# Patient Record
Sex: Female | Born: 1965 | Race: Black or African American | Hispanic: No | Smoking: Never smoker
Health system: Southern US, Community
[De-identification: ages and names within clinical notes are randomized; demographics above are authoritative.]

## PROBLEM LIST (undated history)

## (undated) DIAGNOSIS — B9689 Other specified bacterial agents as the cause of diseases classified elsewhere: Secondary | ICD-10-CM

## (undated) DIAGNOSIS — I10 Essential (primary) hypertension: Secondary | ICD-10-CM

## (undated) DIAGNOSIS — N760 Acute vaginitis: Secondary | ICD-10-CM

## (undated) DIAGNOSIS — F32A Depression, unspecified: Secondary | ICD-10-CM

## (undated) DIAGNOSIS — F329 Major depressive disorder, single episode, unspecified: Secondary | ICD-10-CM

## (undated) DIAGNOSIS — F419 Anxiety disorder, unspecified: Secondary | ICD-10-CM

## (undated) HISTORY — PX: CHOLECYSTECTOMY: SHX55

## (undated) HISTORY — PX: DILATION AND CURETTAGE OF UTERUS: SHX78

---

## 2004-01-28 ENCOUNTER — Emergency Department (HOSPITAL_COMMUNITY): Admission: EM | Admit: 2004-01-28 | Discharge: 2004-01-28 | Payer: Self-pay | Admitting: Family Medicine

## 2004-06-12 ENCOUNTER — Emergency Department (HOSPITAL_COMMUNITY): Admission: EM | Admit: 2004-06-12 | Discharge: 2004-06-12 | Payer: Self-pay | Admitting: Emergency Medicine

## 2004-11-08 ENCOUNTER — Ambulatory Visit: Payer: Self-pay | Admitting: Nurse Practitioner

## 2004-12-27 ENCOUNTER — Ambulatory Visit (HOSPITAL_COMMUNITY): Admission: RE | Admit: 2004-12-27 | Discharge: 2004-12-27 | Payer: Self-pay | Admitting: Internal Medicine

## 2005-03-22 ENCOUNTER — Ambulatory Visit: Payer: Self-pay | Admitting: Nurse Practitioner

## 2005-03-31 ENCOUNTER — Ambulatory Visit: Payer: Self-pay | Admitting: Obstetrics and Gynecology

## 2005-11-08 ENCOUNTER — Emergency Department (HOSPITAL_COMMUNITY): Admission: EM | Admit: 2005-11-08 | Discharge: 2005-11-08 | Payer: Self-pay | Admitting: Family Medicine

## 2006-02-27 ENCOUNTER — Emergency Department (HOSPITAL_COMMUNITY): Admission: EM | Admit: 2006-02-27 | Discharge: 2006-02-27 | Payer: Self-pay | Admitting: Emergency Medicine

## 2006-09-22 ENCOUNTER — Emergency Department (HOSPITAL_COMMUNITY): Admission: EM | Admit: 2006-09-22 | Discharge: 2006-09-22 | Payer: Self-pay | Admitting: Emergency Medicine

## 2007-04-01 ENCOUNTER — Emergency Department (HOSPITAL_COMMUNITY): Admission: EM | Admit: 2007-04-01 | Discharge: 2007-04-01 | Payer: Self-pay | Admitting: Emergency Medicine

## 2007-05-26 ENCOUNTER — Emergency Department (HOSPITAL_COMMUNITY): Admission: EM | Admit: 2007-05-26 | Discharge: 2007-05-26 | Payer: Self-pay | Admitting: Emergency Medicine

## 2008-07-04 ENCOUNTER — Emergency Department (HOSPITAL_COMMUNITY): Admission: EM | Admit: 2008-07-04 | Discharge: 2008-07-04 | Payer: Self-pay | Admitting: Family Medicine

## 2008-10-09 ENCOUNTER — Ambulatory Visit (HOSPITAL_COMMUNITY): Admission: RE | Admit: 2008-10-09 | Discharge: 2008-10-09 | Payer: Self-pay | Admitting: Obstetrics & Gynecology

## 2009-03-18 ENCOUNTER — Emergency Department (HOSPITAL_COMMUNITY): Admission: EM | Admit: 2009-03-18 | Discharge: 2009-03-18 | Payer: Self-pay | Admitting: Family Medicine

## 2009-05-01 ENCOUNTER — Emergency Department (HOSPITAL_COMMUNITY): Admission: EM | Admit: 2009-05-01 | Discharge: 2009-05-01 | Payer: Self-pay | Admitting: Emergency Medicine

## 2009-07-01 ENCOUNTER — Ambulatory Visit: Payer: Self-pay | Admitting: Obstetrics and Gynecology

## 2009-07-02 ENCOUNTER — Encounter: Payer: Self-pay | Admitting: Obstetrics and Gynecology

## 2009-07-02 LAB — CONVERTED CEMR LAB: Trich, Wet Prep: NONE SEEN

## 2009-12-26 ENCOUNTER — Emergency Department (HOSPITAL_COMMUNITY): Admission: EM | Admit: 2009-12-26 | Discharge: 2009-12-26 | Payer: Self-pay | Admitting: Emergency Medicine

## 2010-05-09 ENCOUNTER — Encounter: Payer: Self-pay | Admitting: *Deleted

## 2010-05-10 ENCOUNTER — Encounter: Payer: Self-pay | Admitting: Family Medicine

## 2010-05-17 ENCOUNTER — Ambulatory Visit: Admit: 2010-05-17 | Payer: Self-pay | Admitting: Obstetrics & Gynecology

## 2010-06-23 ENCOUNTER — Other Ambulatory Visit: Payer: Self-pay | Admitting: Internal Medicine

## 2010-06-23 DIAGNOSIS — Z1231 Encounter for screening mammogram for malignant neoplasm of breast: Secondary | ICD-10-CM

## 2010-06-25 ENCOUNTER — Ambulatory Visit: Payer: Self-pay

## 2010-07-01 LAB — POCT URINALYSIS DIPSTICK
Bilirubin Urine: NEGATIVE
Hgb urine dipstick: NEGATIVE
Ketones, ur: NEGATIVE mg/dL
Nitrite: NEGATIVE
Protein, ur: NEGATIVE mg/dL
pH: 7 (ref 5.0–8.0)

## 2010-07-01 LAB — WET PREP, GENITAL: Yeast Wet Prep HPF POC: NONE SEEN

## 2010-07-01 LAB — GC/CHLAMYDIA PROBE AMP, GENITAL: Chlamydia, DNA Probe: NEGATIVE

## 2010-07-04 LAB — POCT URINALYSIS DIP (DEVICE)
Glucose, UA: NEGATIVE mg/dL
Nitrite: NEGATIVE
Protein, ur: NEGATIVE mg/dL
Urobilinogen, UA: 0.2 mg/dL (ref 0.0–1.0)

## 2010-07-04 LAB — GC/CHLAMYDIA PROBE AMP, GENITAL: GC Probe Amp, Genital: NEGATIVE

## 2010-07-04 LAB — POCT PREGNANCY, URINE: Preg Test, Ur: NEGATIVE

## 2010-07-12 LAB — POCT PREGNANCY, URINE: Preg Test, Ur: NEGATIVE

## 2010-07-20 LAB — POCT URINALYSIS DIP (DEVICE)
Glucose, UA: NEGATIVE mg/dL
Ketones, ur: 40 mg/dL — AB
Specific Gravity, Urine: 1.025 (ref 1.005–1.030)
Urobilinogen, UA: 2 mg/dL — ABNORMAL HIGH (ref 0.0–1.0)

## 2010-07-20 LAB — WET PREP, GENITAL
Clue Cells Wet Prep HPF POC: NONE SEEN
Trich, Wet Prep: NONE SEEN
Yeast Wet Prep HPF POC: NONE SEEN

## 2010-07-20 LAB — GC/CHLAMYDIA PROBE AMP, GENITAL
Chlamydia, DNA Probe: NEGATIVE
GC Probe Amp, Genital: NEGATIVE

## 2010-07-29 LAB — WET PREP, GENITAL: Trich, Wet Prep: NONE SEEN

## 2010-07-29 LAB — POCT URINALYSIS DIP (DEVICE)
Glucose, UA: NEGATIVE mg/dL
Specific Gravity, Urine: 1.025 (ref 1.005–1.030)
Urobilinogen, UA: 0.2 mg/dL (ref 0.0–1.0)
pH: 5.5 (ref 5.0–8.0)

## 2010-07-29 LAB — URINE CULTURE

## 2010-07-29 LAB — GC/CHLAMYDIA PROBE AMP, GENITAL: GC Probe Amp, Genital: NEGATIVE

## 2011-01-24 LAB — URINALYSIS, ROUTINE W REFLEX MICROSCOPIC
Glucose, UA: NEGATIVE
Ketones, ur: NEGATIVE
pH: 7

## 2011-01-24 LAB — URINE MICROSCOPIC-ADD ON

## 2011-03-05 ENCOUNTER — Encounter: Payer: Self-pay | Admitting: *Deleted

## 2011-03-05 ENCOUNTER — Emergency Department (INDEPENDENT_AMBULATORY_CARE_PROVIDER_SITE_OTHER)
Admission: EM | Admit: 2011-03-05 | Discharge: 2011-03-05 | Disposition: A | Discharge status: Home / Self Care | Payer: Medicaid Other | Source: Home / Self Care | Attending: Family Medicine | Admitting: Family Medicine

## 2011-03-05 DIAGNOSIS — N760 Acute vaginitis: Secondary | ICD-10-CM

## 2011-03-05 HISTORY — DX: Other specified bacterial agents as the cause of diseases classified elsewhere: B96.89

## 2011-03-05 HISTORY — DX: Essential (primary) hypertension: I10

## 2011-03-05 HISTORY — DX: Acute vaginitis: N76.0

## 2011-03-05 LAB — POCT URINALYSIS DIP (DEVICE)
Bilirubin Urine: NEGATIVE
Glucose, UA: NEGATIVE mg/dL
Leukocytes, UA: NEGATIVE
Nitrite: NEGATIVE

## 2011-03-05 LAB — WET PREP, GENITAL: Trich, Wet Prep: NONE SEEN

## 2011-03-05 LAB — POCT PREGNANCY, URINE: Preg Test, Ur: NEGATIVE

## 2011-03-05 MED ORDER — METRONIDAZOLE 0.75 % VA GEL: VAGINAL | Status: DC

## 2011-03-05 NOTE — ED Provider Notes (Signed)
History     CSN: [id number] Arrival date & time: 03/05/2011 10:25 AM   First MD Initiated Contact with Patient 03/05/11 1000      No chief complaint on file.   (Consider location/radiation/quality/duration/timing/severity/associated sxs/prior treatment) Patient is a 45 y.o. female presenting with vaginal discharge.  Vaginal Discharge This is a recurrent problem. The current episode started more than 1 week ago (h/o bv 2 mos ago. now also with uti sx.). The problem occurs constantly. The problem has not changed since onset.The symptoms are aggravated by nothing. The symptoms are relieved by nothing.    Past Medical History  Diagnosis Date  . Bacterial vaginosis   . Hypertension     Past Surgical History  Procedure Date  . Cholecystectomy     No family history on file.  History  Substance Use Topics  . Smoking status: Never Smoker   . Smokeless tobacco: Not on file  . Alcohol Use: No    OB History    Grav Para Term Preterm Abortions TAB SAB Ect Mult Living                  Review of Systems  Constitutional: Negative.  Negative for fever.  Genitourinary: Positive for dysuria, urgency, frequency and vaginal discharge. Negative for vaginal bleeding and vaginal pain.  Musculoskeletal: Negative for back pain.    Allergies  Review of patient's allergies indicates no known allergies.  Home Medications   Current Outpatient Rx  Name Route Sig Dispense Refill  . HYDROCHLOROTHIAZIDE 25 MG PO TABS Oral Take 25 mg by mouth daily.        BP 143/89  Pulse 69  Temp(Src) 97.7 F (36.5 C) (Oral)  Resp 16  SpO2 100%  LMP 02/19/2011  Physical Exam  Constitutional: She appears well-developed and well-nourished.  HENT:  Head: Normocephalic.  Abdominal: Soft. Bowel sounds are normal. She exhibits no distension and no mass. There is no tenderness. There is no rebound and no guarding.  Genitourinary: Uterus is enlarged. Cervix exhibits no motion tenderness, no discharge  and no friability. Right adnexum displays no tenderness. Left adnexum displays no tenderness. There is erythema around the vagina. No tenderness or bleeding around the vagina. No foreign body around the vagina. Vaginal discharge found.    ED Course  Procedures (including critical care time)   Labs Reviewed  POCT PREGNANCY, URINE  GC/CHLAMYDIA PROBE AMP, GENITAL  WET PREP, GENITAL  POCT URINALYSIS DIPSTICK   No results found.   No diagnosis found.    MDM  U/a neg        Barkley Bruns, MD 03/05/11 [phone]

## 2011-03-11 ENCOUNTER — Telehealth (HOSPITAL_COMMUNITY): Payer: Self-pay | Admitting: *Deleted

## 2011-03-11 NOTE — ED Notes (Signed)
Wet prep showed few yeast and few clue cells.  Pt treated with Metrogel.  Dr. Artis Flock reviewed labs and ordered Terazol 80mg  3 suppositories, 1 intravaginally every night x 3 nights to be called in for pt.

## 2011-03-14 ENCOUNTER — Telehealth (HOSPITAL_COMMUNITY): Payer: Self-pay | Admitting: *Deleted

## 2011-03-14 NOTE — ED Notes (Signed)
Terazol 80mg  suppositories x 3, use 1 intravaginally at night x 3 nights per order of Dr. Artis Flock, called into [facility] on [facility] per pt request.

## 2011-03-14 NOTE — ED Notes (Signed)
Unable to reach pt at her number.  Left message with son again at his number.

## 2011-05-23 ENCOUNTER — Other Ambulatory Visit: Payer: Self-pay | Admitting: Family Medicine

## 2011-05-23 DIAGNOSIS — Z1231 Encounter for screening mammogram for malignant neoplasm of breast: Secondary | ICD-10-CM

## 2011-06-06 ENCOUNTER — Ambulatory Visit: Payer: Medicaid Other

## 2012-04-21 ENCOUNTER — Emergency Department (HOSPITAL_COMMUNITY)
Admission: EM | Admit: 2012-04-21 | Discharge: 2012-04-21 | Disposition: A | Discharge status: Home / Self Care | Payer: Medicaid Other | Attending: Emergency Medicine | Admitting: Emergency Medicine

## 2012-04-21 ENCOUNTER — Encounter (HOSPITAL_COMMUNITY): Payer: Self-pay | Admitting: Emergency Medicine

## 2012-04-21 DIAGNOSIS — R21 Rash and other nonspecific skin eruption: Secondary | ICD-10-CM | POA: Insufficient documentation

## 2012-04-21 DIAGNOSIS — Z8619 Personal history of other infectious and parasitic diseases: Secondary | ICD-10-CM | POA: Insufficient documentation

## 2012-04-21 DIAGNOSIS — Z79899 Other long term (current) drug therapy: Secondary | ICD-10-CM | POA: Insufficient documentation

## 2012-04-21 DIAGNOSIS — I10 Essential (primary) hypertension: Secondary | ICD-10-CM | POA: Insufficient documentation

## 2012-04-21 DIAGNOSIS — J029 Acute pharyngitis, unspecified: Secondary | ICD-10-CM

## 2012-04-21 LAB — RAPID STREP SCREEN (MED CTR MEBANE ONLY): Streptococcus, Group A Screen (Direct): NEGATIVE

## 2012-04-21 NOTE — ED Provider Notes (Signed)
History     CSN: [id number]  Arrival date & time 04/21/12  0919   First MD Initiated Contact with Patient 04/21/12 [phone]      Chief Complaint  Patient presents with  . Rash  . Sore Throat    (Consider location/radiation/quality/duration/timing/severity/associated sxs/prior treatment) Patient is a 47 y.o. female presenting with pharyngitis. The history is provided by the patient.  Sore Throat This is a new problem. The current episode started yesterday. The problem occurs constantly. Associated symptoms include a rash and a sore throat. Pertinent negatives include no coughing, fever, myalgias or nausea. Associated symptoms comments: Sore throat that is mild without fever since yesterday. She also complains of rash that is generalized and pruritic. No nasal congestion, cough, N, V or body aches. She is eating and drinking without difficutly..    Past Medical History  Diagnosis Date  . Bacterial vaginosis   . Hypertension     Past Surgical History  Procedure Date  . Cholecystectomy     History reviewed. No pertinent family history.  History  Substance Use Topics  . Smoking status: Never Smoker   . Smokeless tobacco: Not on file  . Alcohol Use: No    OB History    Grav Para Term Preterm Abortions TAB SAB Ect Mult Living                  Review of Systems  Constitutional: Negative for fever.  HENT: Positive for sore throat. Negative for trouble swallowing.   Respiratory: Negative for cough.   Gastrointestinal: Negative for nausea.  Musculoskeletal: Negative for myalgias.  Skin: Positive for rash.    Allergies  Review of patient's allergies indicates no known allergies.  Home Medications   Current Outpatient Rx  Name  Route  Sig  Dispense  Refill  . HYDROCHLOROTHIAZIDE 25 MG PO TABS   Oral   Take 25 mg by mouth daily.           Marland Kitchen DOXYCYCLINE HYCLATE 100 MG PO CAPS   Oral   Take 100 mg by mouth 2 (two) times daily.           BP 142/83  Pulse 87   Temp 98.4 F (36.9 C) (Oral)  Resp 18  SpO2 100%  LMP 04/01/2012  Physical Exam  Constitutional: She is oriented to person, place, and time. She appears well-developed and well-nourished. No distress.  HENT:  Mouth/Throat: Mucous membranes are not dry. Posterior oropharyngeal erythema present. No oropharyngeal exudate or posterior oropharyngeal edema.  Pulmonary/Chest: Effort normal and breath sounds normal. She has no wheezes. She has no rales.  Abdominal: Soft. Bowel sounds are normal. There is no tenderness.  Neurological: She is alert and oriented to person, place, and time.  Skin:       Rash consisting of singular raised bumps without blister or pustule. Most c/w insect bites.    ED Course  Procedures (including critical care time)   Labs Reviewed  RAPID STREP SCREEN   No results found.   No diagnosis found.  1. Pharyngitis 2. Nonspecific rash   MDM  Uncomplicated pharyngitis, with nonspecific rash felt unrelated to illness.        Arnoldo Hooker, PA-C 04/21/12 1105

## 2012-04-21 NOTE — ED Notes (Signed)
Pt c/o sore throat, rash on arms and face, and swelling to lower lip x 3 days. Denies SOB, denies new medications, food, detergents, etc.

## 2012-04-23 NOTE — ED Provider Notes (Signed)
Medical screening examination/treatment/procedure(s) were performed by non-physician practitioner and as supervising physician I was immediately available for consultation/collaboration.  Layman Gully R. Ulus Hazen, MD 04/23/12 2318 

## 2012-05-03 ENCOUNTER — Emergency Department (HOSPITAL_COMMUNITY)
Admission: EM | Admit: 2012-05-03 | Discharge: 2012-05-03 | Disposition: A | Discharge status: Home / Self Care | Payer: Medicaid Other | Attending: Emergency Medicine | Admitting: Emergency Medicine

## 2012-05-03 ENCOUNTER — Encounter (HOSPITAL_COMMUNITY): Payer: Self-pay | Admitting: Emergency Medicine

## 2012-05-03 DIAGNOSIS — Z79899 Other long term (current) drug therapy: Secondary | ICD-10-CM | POA: Insufficient documentation

## 2012-05-03 DIAGNOSIS — IMO0002 Reserved for concepts with insufficient information to code with codable children: Secondary | ICD-10-CM

## 2012-05-03 DIAGNOSIS — I10 Essential (primary) hypertension: Secondary | ICD-10-CM | POA: Insufficient documentation

## 2012-05-03 DIAGNOSIS — L03019 Cellulitis of unspecified finger: Secondary | ICD-10-CM | POA: Insufficient documentation

## 2012-05-03 DIAGNOSIS — Z8742 Personal history of other diseases of the female genital tract: Secondary | ICD-10-CM | POA: Insufficient documentation

## 2012-05-03 MED ORDER — HYDROCODONE-ACETAMINOPHEN 5-325 MG PO TABS: 2.0000 | ORAL_TABLET | ORAL | Status: DC | PRN

## 2012-05-03 MED ORDER — SULFAMETHOXAZOLE-TRIMETHOPRIM 800-160 MG PO TABS: 1.0000 | ORAL_TABLET | Freq: Two times a day (BID) | ORAL | Status: DC

## 2012-05-03 NOTE — ED Provider Notes (Signed)
History     CSN: [id number]  Arrival date & time 05/03/12  1043   First MD Initiated Contact with Patient 05/03/12 1053      Chief Complaint  Patient presents with  . Hand Pain    (Consider location/radiation/quality/duration/timing/severity/associated sxs/prior treatment) HPI Comments: Patient is a 47 year old female who presents with a 1 week history of right middle finger pain. The pain started gradually and progressively worsened since the onset. The pain is throbbing and severe without radiation. The pain is constant. Patient denies any known injury. Patient reports associated swelling with some erythema. Movement and palpation of the affected finger makes the pain worse. Nothing makes the pain better. Patient has not tried anything for pain. Patient denies numbness/tingling.    Past Medical History  Diagnosis Date  . Bacterial vaginosis   . Hypertension     Past Surgical History  Procedure Date  . Cholecystectomy     No family history on file.  History  Substance Use Topics  . Smoking status: Never Smoker   . Smokeless tobacco: Not on file  . Alcohol Use: No    OB History    Grav Para Term Preterm Abortions TAB SAB Ect Mult Living                  Review of Systems  Musculoskeletal: Positive for joint swelling.  Skin: Positive for wound.  All other systems reviewed and are negative.    Allergies  Review of patient's allergies indicates no known allergies.  Home Medications   Current Outpatient Rx  Name  Route  Sig  Dispense  Refill  . HYDROCHLOROTHIAZIDE 25 MG PO TABS   Oral   Take 25 mg by mouth daily.             BP 139/80  Pulse 95  Temp 97.6 F (36.4 C) (Oral)  Resp 18  SpO2 99%  LMP 04/01/2012  Physical Exam  Nursing note and vitals reviewed. Constitutional: She is oriented to person, place, and time. She appears well-developed and well-nourished. No distress.  HENT:  Head: Normocephalic and atraumatic.  Eyes: Conjunctivae  normal are normal.  Cardiovascular: Normal rate and regular rhythm.  Exam reveals no gallop and no friction rub.   No murmur heard. Pulmonary/Chest: Effort normal and breath sounds normal. She has no wheezes. She has no rales. She exhibits no tenderness.  Abdominal: Soft. There is no tenderness.  Musculoskeletal: Normal range of motion.       Right middle finger reveals generalized edema and erythema with tenderness to palpation to the dorsal distal aspect. ROM limited due to swelling and pain. Area of fluctuance noted around the nailbed.   Neurological: She is alert and oriented to person, place, and time.       Speech is goal-oriented. Moves limbs without ataxia.   Skin: Skin is warm and dry.       See musculoskeletal.   Psychiatric: She has a normal mood and affect. Her behavior is normal.    ED Course  Procedures (including critical care time)  INCISION AND DRAINAGE Performed by: Emilia Beck Consent: Verbal consent obtained. Risks and benefits: risks, benefits and alternatives were discussed Type: abscess  Body area: right middle finger nailbed  Anesthesia: none  Incision was made with a scalpel.  Local anesthetic: none  Anesthetic total: 0 ml  Complexity: simple  Drainage: purulent  Drainage amount: 1 mL  Packing material: none  Patient tolerance: Patient tolerated the procedure well with  no immediate complications.     Labs Reviewed - No data to display No results found.   1. Paronychia       MDM  11:51 AM Patient has a paronychia which was drained. Patient will have bactrim and vicodin for pain. No further evaluation needed at this time.         Emilia Beck, PA-C 05/04/12 [phone]

## 2012-05-03 NOTE — ED Notes (Signed)
Pt complains of pain and swelling to third digit of right hand. Area is swollen, red, and tender at this time.

## 2012-05-04 NOTE — ED Provider Notes (Signed)
Medical screening examination/treatment/procedure(s) were performed by non-physician practitioner and as supervising physician I was immediately available for consultation/collaboration.   Lyanne Co, MD 05/04/12 [phone]

## 2012-11-15 ENCOUNTER — Encounter: Payer: Self-pay | Admitting: Obstetrics & Gynecology

## 2012-11-28 ENCOUNTER — Encounter (HOSPITAL_COMMUNITY): Payer: Self-pay | Admitting: Emergency Medicine

## 2012-11-28 ENCOUNTER — Emergency Department (HOSPITAL_COMMUNITY)
Admission: EM | Admit: 2012-11-28 | Discharge: 2012-11-28 | Disposition: A | Discharge status: Home / Self Care | Payer: Medicaid Other | Attending: Emergency Medicine | Admitting: Emergency Medicine

## 2012-11-28 ENCOUNTER — Emergency Department (HOSPITAL_COMMUNITY): Payer: Medicaid Other

## 2012-11-28 DIAGNOSIS — I10 Essential (primary) hypertension: Secondary | ICD-10-CM | POA: Insufficient documentation

## 2012-11-28 DIAGNOSIS — R350 Frequency of micturition: Secondary | ICD-10-CM | POA: Insufficient documentation

## 2012-11-28 DIAGNOSIS — Z791 Long term (current) use of non-steroidal anti-inflammatories (NSAID): Secondary | ICD-10-CM | POA: Insufficient documentation

## 2012-11-28 DIAGNOSIS — Z8742 Personal history of other diseases of the female genital tract: Secondary | ICD-10-CM | POA: Insufficient documentation

## 2012-11-28 DIAGNOSIS — D259 Leiomyoma of uterus, unspecified: Secondary | ICD-10-CM | POA: Insufficient documentation

## 2012-11-28 DIAGNOSIS — Z79899 Other long term (current) drug therapy: Secondary | ICD-10-CM | POA: Insufficient documentation

## 2012-11-28 DIAGNOSIS — Z3202 Encounter for pregnancy test, result negative: Secondary | ICD-10-CM | POA: Insufficient documentation

## 2012-11-28 DIAGNOSIS — Z9089 Acquired absence of other organs: Secondary | ICD-10-CM | POA: Insufficient documentation

## 2012-11-28 LAB — COMPREHENSIVE METABOLIC PANEL
ALT: 11 U/L (ref 0–35)
Alkaline Phosphatase: 71 U/L (ref 39–117)
BUN: 10 mg/dL (ref 6–23)
Chloride: 104 mEq/L (ref 96–112)
GFR calc Af Amer: 79 mL/min — ABNORMAL LOW (ref >90)
GFR calc non Af Amer: 68 mL/min — ABNORMAL LOW (ref >90)
Glucose, Bld: 88 mg/dL (ref 70–99)

## 2012-11-28 LAB — WET PREP, GENITAL
Clue Cells Wet Prep HPF POC: NONE SEEN
Trich, Wet Prep: NONE SEEN

## 2012-11-28 LAB — URINALYSIS, ROUTINE W REFLEX MICROSCOPIC
Glucose, UA: NEGATIVE mg/dL
Ketones, ur: NEGATIVE mg/dL
Leukocytes, UA: NEGATIVE
Nitrite: NEGATIVE
Protein, ur: NEGATIVE mg/dL

## 2012-11-28 LAB — CBC WITH DIFFERENTIAL/PLATELET
Basophils Absolute: 0 10*3/uL (ref 0.0–0.1)
Basophils Relative: 1 % (ref 0–1)
HCT: 36.4 % (ref 36.0–46.0)
MCHC: 34.6 g/dL (ref 30.0–36.0)
Monocytes Absolute: 0.3 10*3/uL (ref 0.1–1.0)
Neutro Abs: 2.2 10*3/uL (ref 1.7–7.7)
RDW: 12.7 % (ref 11.5–15.5)

## 2012-11-28 LAB — POCT PREGNANCY, URINE: Preg Test, Ur: NEGATIVE

## 2012-11-28 MED ORDER — NAPROXEN 500 MG PO TABS: 500.0000 mg | ORAL_TABLET | Freq: Two times a day (BID) | ORAL | Status: DC

## 2012-11-28 NOTE — ED Notes (Signed)
Pt c/o urinary frequency and lower back pain 8/10 x2 days.  Pt reports walking increases back pain.  Denies burning or blood in urine.

## 2012-11-30 NOTE — ED Provider Notes (Signed)
CSN: [id number]     Arrival date & time 11/28/12  1218 History     First MD Initiated Contact with Patient 11/28/12 1226     Chief Complaint  Patient presents with  . Flank Pain  . Urinary Frequency   (Consider location/radiation/quality/duration/timing/severity/associated sxs/prior Treatment) HPI.... suprapubic abdominal pain, right flank pain, urinary frequency or several days. No vaginal bleeding or discharge. Patient is sexually active. No fever, sweats, chills.  Nothing makes symptoms better or worse. No radiation of pain. Severity is mild to  Past Medical History  Diagnosis Date  . Bacterial vaginosis   . Hypertension    Past Surgical History  Procedure Laterality Date  . Cholecystectomy     History reviewed. No pertinent family history. History  Substance Use Topics  . Smoking status: Never Smoker   . Smokeless tobacco: Not on file  . Alcohol Use: No   OB History   Grav Para Term Preterm Abortions TAB SAB Ect Mult Living                 Review of Systems  All other systems reviewed and are negative.    Allergies  Review of patient's allergies indicates no known allergies.  Home Medications   Current Outpatient Rx  Name  Route  Sig  Dispense  Refill  . hydrochlorothiazide (HYDRODIURIL) 25 MG tablet   Oral   Take 25 mg by mouth daily.           . naproxen (NAPROSYN) 500 MG tablet   Oral   Take 1 tablet (500 mg total) by mouth 2 (two) times daily.   30 tablet   0    BP 137/75  Pulse 87  Temp(Src) 97.7 F (36.5 C)  Resp 18  SpO2 99%  LMP 10/19/2012 Physical Exam  Nursing note and vitals reviewed. Constitutional: She is oriented to person, place, and time. She appears well-developed and well-nourished.  HENT:  Head: Normocephalic and atraumatic.  Eyes: Conjunctivae and EOM are normal. Pupils are equal, round, and reactive to light.  Neck: Normal range of motion. Neck supple.  Cardiovascular: Normal rate, regular rhythm and normal heart  sounds.   Pulmonary/Chest: Effort normal and breath sounds normal.  Abdominal: Soft. Bowel sounds are normal.  Genitourinary:  Normal external genitalia. Cervix and adnexa normal. Uterus shows large anterior fibroid  Musculoskeletal: Normal range of motion.  Neurological: She is alert and oriented to person, place, and time.  Skin: Skin is warm and dry.  Psychiatric: She has a normal mood and affect.    ED Course   Procedures (including critical care time)  Labs Reviewed  WET PREP, GENITAL - Abnormal; Notable for the following:    WBC, Wet Prep HPF POC RARE (*)    All other components within normal limits  CBC WITH DIFFERENTIAL - Abnormal; Notable for the following:    Lymphocytes Relative 47 (*)    All other components within normal limits  COMPREHENSIVE METABOLIC PANEL - Abnormal; Notable for the following:    Potassium 3.3 (*)    Albumin 3.2 (*)    GFR calc non Af Amer 68 (*)    GFR calc Af Amer 79 (*)    All other components within normal limits  GC/CHLAMYDIA PROBE AMP  URINALYSIS, ROUTINE W REFLEX MICROSCOPIC  GLUCOSE, CAPILLARY  POCT PREGNANCY, URINE    No results found. No results found. 1. Uterine fibroid     MDM  Ultrasound shows large uterine fundal fibroid.   Referral to  ob gyn.   No acute abdomen.  Urinalysis normal.  Donnetta Hutching, MD 11/30/12 Rickey Primus

## 2012-12-21 ENCOUNTER — Encounter: Payer: Medicaid Other | Admitting: Obstetrics & Gynecology

## 2013-01-31 ENCOUNTER — Encounter: Payer: Medicaid Other | Admitting: Obstetrics and Gynecology

## 2013-04-11 ENCOUNTER — Emergency Department (HOSPITAL_COMMUNITY): Payer: Medicaid Other

## 2013-04-11 ENCOUNTER — Emergency Department (HOSPITAL_COMMUNITY)
Admission: EM | Admit: 2013-04-11 | Discharge: 2013-04-11 | Disposition: A | Discharge status: Home / Self Care | Payer: Medicaid Other | Attending: Emergency Medicine | Admitting: Emergency Medicine

## 2013-04-11 ENCOUNTER — Encounter (HOSPITAL_COMMUNITY): Payer: Self-pay | Admitting: Emergency Medicine

## 2013-04-11 DIAGNOSIS — Y9389 Activity, other specified: Secondary | ICD-10-CM | POA: Insufficient documentation

## 2013-04-11 DIAGNOSIS — S8990XA Unspecified injury of unspecified lower leg, initial encounter: Secondary | ICD-10-CM | POA: Insufficient documentation

## 2013-04-11 DIAGNOSIS — I10 Essential (primary) hypertension: Secondary | ICD-10-CM | POA: Insufficient documentation

## 2013-04-11 DIAGNOSIS — Y92410 Unspecified street and highway as the place of occurrence of the external cause: Secondary | ICD-10-CM | POA: Insufficient documentation

## 2013-04-11 DIAGNOSIS — Z8619 Personal history of other infectious and parasitic diseases: Secondary | ICD-10-CM | POA: Insufficient documentation

## 2013-04-11 DIAGNOSIS — S0993XA Unspecified injury of face, initial encounter: Secondary | ICD-10-CM | POA: Insufficient documentation

## 2013-04-11 DIAGNOSIS — Z23 Encounter for immunization: Secondary | ICD-10-CM | POA: Insufficient documentation

## 2013-04-11 DIAGNOSIS — S298XXA Other specified injuries of thorax, initial encounter: Secondary | ICD-10-CM | POA: Insufficient documentation

## 2013-04-11 DIAGNOSIS — Z8742 Personal history of other diseases of the female genital tract: Secondary | ICD-10-CM | POA: Insufficient documentation

## 2013-04-11 DIAGNOSIS — IMO0002 Reserved for concepts with insufficient information to code with codable children: Secondary | ICD-10-CM | POA: Insufficient documentation

## 2013-04-11 DIAGNOSIS — E669 Obesity, unspecified: Secondary | ICD-10-CM | POA: Insufficient documentation

## 2013-04-11 DIAGNOSIS — Z79899 Other long term (current) drug therapy: Secondary | ICD-10-CM | POA: Insufficient documentation

## 2013-04-11 MED ORDER — HYDROCODONE-ACETAMINOPHEN 5-325 MG PO TABS: 1.0000 | ORAL_TABLET | ORAL | Status: DC | PRN

## 2013-04-11 MED ORDER — HYDROCODONE-ACETAMINOPHEN 5-325 MG PO TABS: 1.0000 | ORAL_TABLET | Freq: Four times a day (QID) | ORAL | Status: DC | PRN

## 2013-04-11 MED ORDER — BACITRACIN ZINC 500 UNIT/GM EX OINT
TOPICAL_OINTMENT | Freq: Two times a day (BID) | CUTANEOUS | Status: DC
  Administered 2013-04-11: 1 via TOPICAL

## 2013-04-11 MED ORDER — HYDROCODONE-ACETAMINOPHEN 5-325 MG PO TABS
2.0000 | ORAL_TABLET | Freq: Once | ORAL | Status: AC
  Administered 2013-04-11: 2 via ORAL
  Filled 2013-04-11: qty 2

## 2013-04-11 MED ORDER — TETANUS-DIPHTH-ACELL PERTUSSIS 5-2.5-18.5 LF-MCG/0.5 IM SUSP
0.5000 mL | Freq: Once | INTRAMUSCULAR | Status: AC
  Administered 2013-04-11: 0.5 mL via INTRAMUSCULAR
  Filled 2013-04-11: qty 0.5
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
## 2013-04-11 NOTE — ED Notes (Signed)
Patient transported to X-ray 

## 2013-04-11 NOTE — ED Notes (Signed)
Bacitracin ointment applied by [place], RN, Pts. Previous nurse.

## 2013-04-11 NOTE — ED Notes (Signed)
Pt. Fully dressed and refused vital.s
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
## 2013-04-11 NOTE — ED Notes (Signed)
MD at bedside. 

## 2013-04-11 NOTE — ED Notes (Signed)
Ortho tech called to place knee immobilizer on pt.

## 2013-04-24 ENCOUNTER — Other Ambulatory Visit: Payer: Self-pay | Admitting: Internal Medicine

## 2013-04-24 DIAGNOSIS — Z1231 Encounter for screening mammogram for malignant neoplasm of breast: Secondary | ICD-10-CM

## 2013-05-29 ENCOUNTER — Encounter: Payer: Medicaid Other | Admitting: Obstetrics and Gynecology

## 2013-08-12 ENCOUNTER — Other Ambulatory Visit (HOSPITAL_COMMUNITY)
Admission: RE | Admit: 2013-08-12 | Discharge: 2013-08-12 | Disposition: A | Discharge status: Home / Self Care | Payer: Medicaid Other | Source: Ambulatory Visit | Attending: Obstetrics and Gynecology | Admitting: Obstetrics and Gynecology

## 2013-08-12 ENCOUNTER — Encounter: Payer: Self-pay | Admitting: Obstetrics & Gynecology

## 2013-08-12 ENCOUNTER — Ambulatory Visit (INDEPENDENT_AMBULATORY_CARE_PROVIDER_SITE_OTHER): Payer: Medicaid Other | Admitting: Obstetrics & Gynecology

## 2013-08-12 VITALS — BP 123/76 | HR 74 | Temp 98.8°F | Ht 64.0 in | Wt 212.7 lb

## 2013-08-12 DIAGNOSIS — D219 Benign neoplasm of connective and other soft tissue, unspecified: Secondary | ICD-10-CM

## 2013-08-12 DIAGNOSIS — N920 Excessive and frequent menstruation with regular cycle: Secondary | ICD-10-CM | POA: Insufficient documentation

## 2013-08-12 DIAGNOSIS — Z01812 Encounter for preprocedural laboratory examination: Secondary | ICD-10-CM

## 2013-08-12 DIAGNOSIS — D259 Leiomyoma of uterus, unspecified: Secondary | ICD-10-CM

## 2013-08-12 LAB — POCT PREGNANCY, URINE: Preg Test, Ur: NEGATIVE

## 2013-08-12 MED ORDER — MEGESTROL ACETATE 20 MG PO TABS: 40.0000 mg | ORAL_TABLET | Freq: Every day | ORAL | Status: DC

## 2013-08-12 NOTE — Progress Notes (Signed)
Subjective:     Patient ID: Carrie Bryan, female   DOB: [birth year], 48 y.o.   MRN: [id number]  HPI Pt presents with c/o pain and bleeding. She reports heavy bleeding with occ intermenstrual.  She reports that she always has the feeling of needing to void.  She reels pressure at all times.  Past Medical History  Diagnosis Date  . Bacterial vaginosis   . Hypertension    Past Surgical History  Procedure Laterality Date  . Cholecystectomy     Current Outpatient Prescriptions on File Prior to Visit  Medication Sig Dispense Refill  . hydrochlorothiazide (HYDRODIURIL) 25 MG tablet Take 25 mg by mouth daily.        Marland Kitchen HYDROcodone-acetaminophen (NORCO) 5-325 MG per tablet Take 1-2 tablets by mouth every 6 (six) hours as needed.  20 tablet  0   No current facility-administered medications on file prior to visit.   No Known Allergies     Review of Systems     Objective:   Physical Exam BP 123/76  Pulse 74  Temp(Src) 98.8 F (37.1 C) (Oral)  Ht 5\' 4"  (1.626 m)  Wt 212 lb 11.2 oz (96.48 kg)  BMI 36.49 kg/m2  LMP 07/11/2013  Abd: soft, NT, ND GU: EGBUS: no lesions Vagina: no blood in vault Cervix: no lesion; no mucopurulent d/c Uterus: large 18 weeks, mobile Adnexa: no masses; non tender   The indications for endometrial biopsy were reviewed.   Risks of the biopsy including cramping, bleeding, infection, uterine perforation, inadequate specimen and need for additional procedures  were discussed. The patient states she understands and agrees to undergo procedure today. Consent was signed. Time out was performed. Urine HCG was negative. A sterile speculum was placed in the patient's vagina and the cervix was prepped with Betadine. A single-toothed tenaculum was placed on the anterior lip of the cervix to stabilize it. The 3 mm pipelle was introduced into the endometrial cavity without difficulty to a depth of 9cm, and a moderate amount of tissue was obtained and sent to pathology.  The instruments were removed from the patient's vagina. Minimal bleeding from the cervix was noted. The patient tolerated the procedure well.     11/2012 Clinical Data: Right-sided flank pain and urinary frequency.  History uterine fibroids.  TRANSABDOMINAL AND TRANSVAGINAL ULTRASOUND OF PELVIS  Technique: Both transabdominal and transvaginal ultrasound  examinations of the pelvis were performed. Transabdominal technique  was performed for global imaging of the pelvis including uterus,  ovaries, adnexal regions, and pelvic cul-de-sac.  It was necessary to proceed with endovaginal exam following the  transabdominal exam to visualize the uterus, ovaries, and adnexa .  Comparison: 10/09/2008  Findings:  Uterus: 15.2 x 8.3 x 10.8 cm. Dominant uterine fibroid involves  the fundus. Heterogeneous, 8.5 x 8.9 x 8.0 cm. 2 lesions,  measuring up to 7.3 cm, were present in this areaon the prior.  Endometrium: Mildly thickened, at 1.8 cm. Suboptimally visualized  secondary uterine fibroids.  Right ovary: 3.8 x 2.4 x 2.7 cm. Normal in morphology.  Left ovary: 3.3 x 2.6 x 2.9 cm. Normal in morphology.  Other findings: No free fluid  IMPRESSION:  1. Dominant uterine fundal fibroid.  2. Mild endometrial thickening. Endometrium partially obscured by  uterine fibroids. Consider follow-up with pelvic ultrasound in six  - 12 weeks, the week following the patient's menses.      Assessment:     Large uterine fibroid - treatment options reviewed including [place] and RATH.  Pt  requests definitve treatment with RATH.        Plan:     Routine post-procedure instructions were given to the patient. The patient will follow up to review the results and for further management.    Patient desires surgical management with [place] with bilateral salpingectomy.  The risks of surgery were discussed in detail with the patient including but not limited to: bleeding which may require transfusion or reoperation; infection  which may require prolonged hospitalization or re-hospitalization and antibiotic therapy; injury to bowel, bladder, ureters and major vessels or other surrounding organs; need for additional procedures including laparotomy; thromboembolic phenomenon, incisional problems and other postoperative or anesthesia complications.  Patient was told that the likelihood that her condition and symptoms will be treated effectively with this surgical management was very high; the postoperative expectations were also discussed in detail. The patient also understands the alternative treatment options which were discussed in full. All questions were answered.  She was told that she will be contacted by our surgical scheduler regarding the time and date of her surgery; routine preoperative instructions of having nothing to eat or drink after midnight on the day prior to surgery and also coming to the hospital 1 1/2 hours prior to her time of surgery were also emphasized.  She was told she may be called for a preoperative appointment about a week prior to surgery and will be given further preoperative instructions at that visit. Printed patient education handouts about the procedure were given to the patient to review at home.

## 2013-08-12 NOTE — Patient Instructions (Addendum)
Uterine Fibroid A uterine fibroid is a growth (tumor) that occurs in your uterus. This type of tumor is not cancerous and does not spread out of the uterus. You can have one or many fibroids. Fibroids can vary in size, weight, and where they grow in the uterus. Some can become quite large. Most fibroids do not require medical treatment, but some can cause pain or heavy bleeding during and between periods. CAUSES  A fibroid is the result of a single uterine cell that keeps growing (unregulated), which is different than most cells in the human body. Most cells have a control mechanism that keeps them from reproducing without control.  SIGNS AND SYMPTOMS   Bleeding.  Pelvic pain and pressure.  Bladder problems due to the size of the fibroid.  Infertility and miscarriages depending on the size and location of the fibroid. DIAGNOSIS  Uterine fibroids are diagnosed through a physical exam. Your health care provider may feel the lumpy tumors during a pelvic exam. Ultrasonography may be done to get information regarding size, location, and number of tumors.  TREATMENT   Your health care provider may recommend watchful waiting. This involves getting the fibroid checked by your health care provider to see if it grows or shrinks.   Hormone treatment or an intrauterine device (IUD) may be prescribed.   Surgery may be needed to remove the fibroids (myomectomy) or the uterus (hysterectomy). This depends on your situation. When fibroids interfere with fertility and a woman wants to become pregnant, a health care provider may recommend having the fibroids removed.  [place] care depends on how you were treated. In general:   Keep all follow-up appointments with your health care provider.   Only take over-the-counter or prescription medicines as directed by your health care provider. If you were prescribed a hormone treatment, take the hormone medicines exactly as directed. Do not  take aspirin. It can cause bleeding.   Talk to your health care provider about taking iron pills.  If your periods are troublesome but not so heavy, lie down with your feet raised slightly above your heart. Place cold packs on your lower abdomen.   If your periods are heavy, write down the number of pads or tampons you use per month. Bring this information to your health care provider.   Include green vegetables in your diet.  SEEK IMMEDIATE MEDICAL CARE IF:  You have pelvic pain or cramps not controlled with medicines.   You have a sudden increase in pelvic pain.   You have an increase in bleeding between and during periods.   You have excessive periods and soak tampons or pads in a half hour or less.  You feel lightheaded or have fainting episodes. Document Released: 04/01/2000 Document Revised: 01/23/2013 Document Reviewed: 11/01/2012 [facility] Patient Information 2014 [place], [place]. Uterine Artery Embolization for Fibroids Uterine artery embolization is a nonsurgical treatment to shrink fibroids. A thin plastic tube (catheter) is used to inject material that blocks off the blood supply to the fibroid, which causes the fibroid to shrink. LET [facility] A CARE PROVIDER KNOW ABOUT:  Any allergies you have.  All medicines you are taking, including vitamins, herbs, eye drops, creams, and over-the-counter medicines.  Previous problems you or members of your family have had with the use of anesthetics.  Any blood disorders you have.  Previous surgeries you have had.  Medical conditions you have. RISKS AND COMPLICATIONS  Injury to the uterus from decreased blood supply  Infection.  Blood infection (septicemia).  Lack of menstrual periods (amenorrhea).  Death of tissue cells (necrosis) around your bladder or vulva.  Development of a hole between organs or from an organ to the surface of your skin (fistula).  Blood clot in the legs (deep vein thrombosis) or lung  (pulmonary embolus). BEFORE THE PROCEDURE  Ask your health care provider about changing or stopping your regular medicines.   Do not take aspirin or blood thinners (anticoagulants) for 1 week before the surgery or as directed by your health care provider.  Do not eat or drink anything for 8 hours before the surgery or as directed by your health care provider.   Empty your bladder before the procedure begins. PROCEDURE   An IV tube will be placed into one of your veins. This will be used to give you a sedative and pain medication (conscious sedation).  You will be given a medicine that numbs the area (local anesthetic).  A small cut will be made in your groin. A catheter is then inserted into the main artery of your leg.  The catheter will be guided through the artery to your uterus. A series of images will be taken while dye is injected through the catheter in your groin. X-rays are taken at the same time. This is done to provide a road map of the blood supply to your uterus and fibroids.  Tiny plastic spheres, about the size of sand grains, will be injected through the catheter. Metal coils may be used to help block the artery. The particles will lodge in tiny branches of the uterine artery that supplies blood to the fibroids.  The procedure is repeated on the artery that supplies the other side of the uterus.  The catheter is then removed and pressure is held to stop any bleeding. No stitches are needed.  A dressing is then placed over the cut (incision). AFTER THE PROCEDURE  You will be taken to a recovery area where your progress will be monitored until you are awake, stable, and taking fluids well. If there are no other problems, you will then be moved to a regular hospital room.  You will be observed overnight in the hospital.  You will have cramping that should be controlled with pain medication. Document Released: 06/20/2005 Document Revised: 01/23/2013 Document Reviewed:  10/18/2012 [employer] Patient Information 2014 [employer].  

## 2013-09-24 ENCOUNTER — Telehealth: Payer: Self-pay | Admitting: General Practice

## 2013-09-24 NOTE — Telephone Encounter (Signed)
Patient called and left message stating she was seen here a few months ago and had a fibroid tested for cancer but no one has called her with the results and she is also supposed to have surgery in July but she hasn't heard anything about that either. Called patient and informed her of normal endo bx and gave her [place]'s phone number to call in regards to her surgery date and to call us back if she is unable to find out anything. Patient verbalized understanding to all and had no further questions

## 2013-10-28 ENCOUNTER — Ambulatory Visit (HOSPITAL_COMMUNITY)
Admission: RE | Admit: 2013-10-28 | Payer: Medicaid Other | Source: Ambulatory Visit | Admitting: Obstetrics & Gynecology

## 2013-10-28 ENCOUNTER — Encounter (HOSPITAL_COMMUNITY): Admission: RE | Payer: Self-pay | Source: Ambulatory Visit

## 2013-10-28 SURGERY — ROBOTIC ASSISTED TOTAL HYSTERECTOMY
Anesthesia: Choice | Site: Abdomen

## 2013-11-11 ENCOUNTER — Ambulatory Visit (INDEPENDENT_AMBULATORY_CARE_PROVIDER_SITE_OTHER): Payer: Medicaid Other | Admitting: Obstetrics & Gynecology

## 2013-11-11 ENCOUNTER — Encounter: Payer: Self-pay | Admitting: Obstetrics & Gynecology

## 2013-11-11 VITALS — BP 121/51 | HR 79 | Temp 98.4°F | Ht 64.0 in | Wt 217.0 lb

## 2013-11-11 DIAGNOSIS — R102 Pelvic and perineal pain: Secondary | ICD-10-CM

## 2013-11-11 DIAGNOSIS — N949 Unspecified condition associated with female genital organs and menstrual cycle: Secondary | ICD-10-CM

## 2013-11-11 DIAGNOSIS — R109 Unspecified abdominal pain: Secondary | ICD-10-CM

## 2013-11-11 DIAGNOSIS — R1030 Lower abdominal pain, unspecified: Secondary | ICD-10-CM

## 2013-11-11 NOTE — Progress Notes (Signed)
Subjective:     Patient ID: Carrie Bryan, female   DOB: 1965/04/22, 48 y.o.   MRN: [id number]  HPI Pt reports that her menses have decreased in the past 2 months but, she continues to have pain.  She denies any new sx.  She reports being sexually active but, with only moderate drive.  Past Medical History  Diagnosis Date  . Bacterial vaginosis   . Hypertension    Past Surgical History  Procedure Laterality Date  . Cholecystectomy     No Known Allergies  Current Outpatient Prescriptions on File Prior to Visit  Medication Sig Dispense Refill  . hydrochlorothiazide (HYDRODIURIL) 25 MG tablet Take 25 mg by mouth daily.         No current facility-administered medications on file prior to visit.   History   Social History  . Marital Status: Divorced    Spouse Name: N/A    Number of Children: N/A  . Years of Education: N/A   Occupational History  . Not on file.   Social History Main Topics  . Smoking status: Never Smoker   . Smokeless tobacco: Never Used  . Alcohol Use: No  . Drug Use: No  . Sexual Activity: Not on file   Other Topics Concern  . Not on file   Social History Narrative  . No narrative on file   History reviewed. No pertinent family history.      Review of Systems     Objective:   Physical Exam BP 121/51  Pulse 79  Temp(Src) 98.4 F (36.9 C) (Oral)  Ht 5\' 4"  (1.626 m)  Wt 217 lb (98.431 kg)  BMI 37.23 kg/m2  LMP 10/25/2013 Pt in NAD Abd: soft, NT, ND.  Mass palpable under umbilicus  GU: EGBUS: no lesions Vagina: no blood in vault Cervix: no lesion; no mucopurulent d/c Uterus: 16-18 weeks sized Adnexa: no masses; non tender         Assessment:     Pelvic pain and uterine fibroids. For definitive treatment     Plan:     Patient desires surgical management with robotic assisted hysterectomy with bilateral salpingectomy.  The risks of surgery were discussed in detail with the patient including but not limited to: bleeding which  may require transfusion or reoperation; infection which may require prolonged hospitalization or re-hospitalization and antibiotic therapy; injury to bowel, bladder, ureters and major vessels or other surrounding organs; need for additional procedures including laparotomy; thromboembolic phenomenon, incisional problems and other postoperative or anesthesia complications.  Patient was told that the likelihood that her condition and symptoms will be treated effectively with this surgical management was very high; the postoperative expectations were also discussed in detail. The patient also understands the alternative treatment options which were discussed in full. All questions were answered.    Keep preop assessment Review pre and post op instructions

## 2013-11-11 NOTE — Patient Instructions (Signed)
Laparoscopically Assisted Vaginal Hysterectomy  A laparoscopically assisted vaginal hysterectomy (LAVH) is a surgical procedure to remove the uterus and cervix, and sometimes the ovaries and fallopian tubes. During an LAVH, some of the surgical removal is done through the vagina, and the rest is done through a few small surgical cuts (incisions) in the abdomen.  This procedure is usually considered in women when a vaginal hysterectomy is not an option. Your health care provider will discuss the risks and benefits of the different surgical techniques at your appointment. Generally, recovery time is faster and there are fewer complications after laparoscopic procedures than after open incisional procedures. LET YOUR HEALTH CARE PROVIDER KNOW ABOUT:   Any allergies you have.  All medicines you are taking, including vitamins, herbs, eye drops, creams, and over-the-counter medicines.  Previous problems you or members of your family have had with the use of anesthetics.  Any blood disorders you have.  Previous surgeries you have had.  Medical conditions you have. RISKS AND COMPLICATIONS Generally, this is a safe procedure. However, as with any procedure, complications can occur. Possible complications include:  Allergies to medicines.  Difficulty breathing.  Bleeding.  Infection.  Damage to other structures near your uterus and cervix. BEFORE THE PROCEDURE  Ask your health care provider about changing or stopping your regular medicines.  Take certain medicines, such as a colon-emptying preparation, as directed.  Do not eat or drink anything for at least 8 hours before your surgery.  Stop smoking if you smoke. Stopping will improve your health after surgery.  Arrange for a ride home after surgery and for help at home during recovery. PROCEDURE   An IV tube will be put into one of your veins in order to give you fluids and medicines.  You will receive medicines to relax you and  medicines that make you sleep (general anesthetic).  You may have a flexible tube (catheter) put into your bladder to drain urine.  You may have a tube put through your nose or mouth that goes into your stomach (nasogastric tube). The nasogastric tube removes digestive fluids and prevents you from feeling nauseated and from vomiting.  Tight-fitting (compression) stockings will be placed on your legs to promote circulation.  Three to four small incisions will be made in your abdomen. An incision also will be made in your vagina. Probes and tools will be inserted into the small incisions. The uterus and cervix are removed (and possibly your ovaries and fallopian tubes) through your vagina as well as through the small incisions that were made in the abdomen.  Your vagina is then sewn back to normal. AFTER THE PROCEDURE  You may have a liquid diet temporarily. You will most likely return to, and tolerate, your usual diet the day after surgery.  You will be passing urine through a catheter. It will be removed the day after surgery.  Your temperature, breathing rate, heart rate, blood pressure, and oxygen level will be monitored regularly.  You will still wear compression stockings on your legs until you are able to move around.  You will use a special device or do breathing exercises to keep your lungs clear.  You will be encouraged to walk as soon as possible. Document Released: 03/24/2011 Document Revised: 12/05/2012 Document Reviewed: 10/18/2012 [employer] Patient Information 2015 [employer]. This information is not intended to replace advice given to you by your health care provider. Make sure you discuss any questions you have with your health care provider.  Laparoscopically Assisted   Vaginal Hysterectomy, Care After Refer to this sheet in the next few weeks. These instructions provide you with information on caring for yourself after your procedure. Your health care provider may also  give you more specific instructions. Your treatment has been planned according to current medical practices, but problems sometimes occur. Call your health care provider if you have any problems or questions after your procedure. WHAT TO EXPECT AFTER THE PROCEDURE After your procedure, it is typical to have the following:  Abdominal pain. You will be given pain medicine to control it.  Sore throat from the breathing tube that was inserted during surgery. HOME CARE INSTRUCTIONS  Only take over-the-counter or prescription medicines for pain, discomfort, or fever as directed by your health care provider.  Do not take aspirin. It can cause bleeding.  Do not drive when taking pain medicine.  Follow your health care provider's advice regarding diet, exercise, lifting, driving, and general activities.  Resume your usual diet as directed and allowed.  Get plenty of rest and sleep.  Do not douche, use tampons, or have sexual intercourse for at least 6 weeks, or until your health care provider gives you permission.  Change your bandages (dressings) as directed by your health care provider.  Monitor your temperature and notify your health care provider of a fever.  Take showers instead of baths for 2-3 weeks.  Do not drink alcohol until your health care provider gives you permission.  If you develop constipation, you may take a mild laxative with your health care provider's permission. Bran foods may help with constipation problems. Drinking enough fluids to keep your urine clear or pale yellow may help as well.  Try to have someone home with you for 1-2 weeks to help around the house.  Keep all of your follow-up appointments as directed by your health care provider. SEEK MEDICAL CARE IF:   You have swelling, redness, or increasing pain around your incision sites.  You have pus coming from your incision.  You notice a bad smell coming from your incision.  Your incision breaks  open.  You feel dizzy or lightheaded.  You have pain or bleeding when you urinate.  You have persistent diarrhea.  You have persistent nausea and vomiting.  You have abnormal vaginal discharge.  You have a rash.  You have any type of abnormal reaction or develop an allergy to your medicine.  You have poor pain control with your prescribed medicine. SEEK IMMEDIATE MEDICAL CARE IF:   You have a fever.  You have severe abdominal pain.  You have chest pain.  You have shortness of breath.  You faint.  You have pain, swelling, or redness in your leg.  You have heavy vaginal bleeding with blood clots. MAKE SURE YOU:  Understand these instructions.  Will watch your condition.  Will get help right away if you are not doing well or get worse. Document Released: 03/24/2011 Document Revised: 04/09/2013 Document Reviewed: 10/18/2012 [employer] Patient Information 2015 [employer]. This information is not intended to replace advice given to you by your health care provider. Make sure you discuss any questions you have with your health care provider.  

## 2013-11-18 ENCOUNTER — Other Ambulatory Visit (HOSPITAL_COMMUNITY): Payer: Medicaid Other

## 2013-11-22 ENCOUNTER — Telehealth: Payer: Self-pay | Admitting: Obstetrics & Gynecology

## 2013-11-22 ENCOUNTER — Inpatient Hospital Stay (HOSPITAL_COMMUNITY)
Admission: RE | Admit: 2013-11-22 | Discharge: 2013-11-22 | Disposition: A | Discharge status: Home / Self Care | Payer: Medicaid Other | Source: Ambulatory Visit

## 2013-11-22 NOTE — Telephone Encounter (Signed)
Notified by Hassan Rowan in pre-op that patient did not keep her pre-op appointment.  Surgery has been cancelled after several unsuccessful attempts to reach patient.

## 2013-11-22 NOTE — Pre-Procedure Instructions (Signed)
Dr Ihor Dow made aware of patient no show for PAT appt 11/22/13. She will follow up.

## 2013-11-22 NOTE — Telephone Encounter (Signed)
Left message on patient's home, cell and emergency contact to have patient call.  Patient has [place] scheduled for 11/26/13 and has not had pre-op appointment.  Will try back later, if unable to reach patient, surgery will be cancelled.

## 2013-11-22 NOTE — Pre-Procedure Instructions (Signed)
Pt is no show for PAT appt. Message left on [facility] phone to make them aware.

## 2013-11-23 ENCOUNTER — Other Ambulatory Visit (HOSPITAL_COMMUNITY)
Admission: RE | Admit: 2013-11-23 | Discharge: 2013-11-23 | Disposition: A | Discharge status: Home / Self Care | Payer: Medicaid Other | Source: Ambulatory Visit | Attending: Family Medicine | Admitting: Family Medicine

## 2013-11-23 ENCOUNTER — Emergency Department (INDEPENDENT_AMBULATORY_CARE_PROVIDER_SITE_OTHER)
Admission: EM | Admit: 2013-11-23 | Discharge: 2013-11-23 | Disposition: A | Discharge status: Home / Self Care | Payer: Medicaid Other | Source: Home / Self Care | Attending: Family Medicine | Admitting: Family Medicine

## 2013-11-23 ENCOUNTER — Encounter (HOSPITAL_COMMUNITY): Payer: Self-pay | Admitting: Emergency Medicine

## 2013-11-23 DIAGNOSIS — Z113 Encounter for screening for infections with a predominantly sexual mode of transmission: Secondary | ICD-10-CM | POA: Diagnosis not present

## 2013-11-23 DIAGNOSIS — N760 Acute vaginitis: Secondary | ICD-10-CM | POA: Insufficient documentation

## 2013-11-23 MED ORDER — METRONIDAZOLE 0.75 % VA GEL: 1.0000 | Freq: Two times a day (BID) | VAGINAL | Status: DC

## 2013-11-23 MED ORDER — FLUCONAZOLE 150 MG PO TABS: 150.0000 mg | ORAL_TABLET | Freq: Once | ORAL | Status: DC

## 2013-11-23 NOTE — Discharge Instructions (Signed)
Thank you for coming in today. Take the fluconazole and use the flagyl gell.  Come back as needed.    Bacterial Vaginosis Bacterial vaginosis is a vaginal infection that occurs when the normal balance of bacteria in the vagina is disrupted. It results from an overgrowth of certain bacteria. This is the most common vaginal infection in women of childbearing age. Treatment is important to prevent complications, especially in pregnant women, as it can cause a premature delivery. CAUSES  Bacterial vaginosis is caused by an increase in harmful bacteria that are normally present in smaller amounts in the vagina. Several different kinds of bacteria can cause bacterial vaginosis. However, the reason that the condition develops is not fully understood. RISK FACTORS Certain activities or behaviors can put you at an increased risk of developing bacterial vaginosis, including:  Having a new sex partner or multiple sex partners.  Douching.  Using an intrauterine device (IUD) for contraception. Women do not get bacterial vaginosis from toilet seats, bedding, swimming pools, or contact with objects around them. SIGNS AND SYMPTOMS  Some women with bacterial vaginosis have no signs or symptoms. Common symptoms include:  Grey vaginal discharge.  A fishlike odor with discharge, especially after sexual intercourse.  Itching or burning of the vagina and vulva.  Burning or pain with urination. DIAGNOSIS  Your health care provider will take a medical history and examine the vagina for signs of bacterial vaginosis. A sample of vaginal fluid may be taken. Your health care provider will look at this sample under a microscope to check for bacteria and abnormal cells. A vaginal pH test may also be done.  TREATMENT  Bacterial vaginosis may be treated with antibiotic medicines. These may be given in the form of a pill or a vaginal cream. A second round of antibiotics may be prescribed if the condition comes back  after treatment.  HOME CARE INSTRUCTIONS   Only take over-the-counter or prescription medicines as directed by your health care provider.  If antibiotic medicine was prescribed, take it as directed. Make sure you finish it even if you start to feel better.  Do not have sex until treatment is completed.  Tell all sexual partners that you have a vaginal infection. They should see their health care provider and be treated if they have problems, such as a mild rash or itching.  Practice safe sex by using condoms and only having one sex partner. SEEK MEDICAL CARE IF:   Your symptoms are not improving after 3 days of treatment.  You have increased discharge or pain.  You have a fever. MAKE SURE YOU:   Understand these instructions.  Will watch your condition.  Will get help right away if you are not doing well or get worse. FOR MORE INFORMATION  Centers for Disease Control and Prevention, Division of STD Prevention: [employer]raiser[employer]raud[employer] American Sexual Health Association (ASHA): www.ashastd.org  Document Released: 04/04/2005 Document Revised: 01/23/2013 Document Reviewed: 11/14/2012 [facility] Patient Information 2015 [place], [place]. This information is not intended to replace advice given to you by your health care provider. Make sure you discuss any questions you have with your health care provider.

## 2013-11-23 NOTE — ED Notes (Signed)
Pt reports a yellow vag d/c x 1 week Sx also include itching and dysuria Denies f/v/n/d, abd/back pain Alert w/no signs of acute distress.

## 2013-11-23 NOTE — ED Provider Notes (Signed)
Carrie Bryan is a 48 y.o. female who presents to Urgent Care today for vaginal discharge and itching. Symptoms present for 1 week and are c/w prior episodes of BV. No NVD fevers or chills. No treatment tried yet. Patient feels well otherwise.    Past Medical History  Diagnosis Date  . Bacterial vaginosis   . Hypertension    History  Substance Use Topics  . Smoking status: Never Smoker   . Smokeless tobacco: Never Used  . Alcohol Use: No   ROS as above Medications: No current facility-administered medications for this encounter.   Current Outpatient Prescriptions  Medication Sig Dispense Refill  . hydrochlorothiazide (HYDRODIURIL) 25 MG tablet Take 25 mg by mouth daily.        . fluconazole (DIFLUCAN) 150 MG tablet Take 1 tablet (150 mg total) by mouth once.  1 tablet  1  . ibuprofen (ADVIL,MOTRIN) 200 MG tablet Take 200 mg by mouth every 6 (six) hours as needed.      . metroNIDAZOLE (METROGEL) 0.75 % vaginal gel Place 1 Applicatorful vaginally 2 (two) times daily.  70 g  0    Exam:  BP 136/71  Pulse 86  Temp(Src) 98.1 F (36.7 C) (Oral)  SpO2 97%  LMP 10/25/2013 Gen: Well NAD obese HEENT: EOMI,  MMM Lungs: Normal work of breathing. CTABL Heart: RRR no MRG Abd: NABS, Soft. Nondistended, Nontender Exts: Brisk capillary refill, warm and well perfused.  GYN: Normal external genitalia. Vaginal canal with thin discharge. Normal appearing cervix.   No results found for this or any previous visit (from the past 24 hour(s)). No results found.  Assessment and Plan: 48 y.o. female with vaginitis. Plan flagyl gel and fluconazole. Cytology pending.   Discussed warning signs or symptoms. Please see discharge instructions. Patient expresses understanding.   This note was created using Systems analyst. Any transcription errors are unintended.    Gregor Hams, MD 11/23/13 [phone]

## 2013-11-26 ENCOUNTER — Ambulatory Visit (HOSPITAL_COMMUNITY)
Admission: RE | Admit: 2013-11-26 | Payer: Medicaid Other | Source: Ambulatory Visit | Admitting: Obstetrics & Gynecology

## 2013-11-26 ENCOUNTER — Encounter (HOSPITAL_COMMUNITY): Admission: RE | Payer: Self-pay | Source: Ambulatory Visit

## 2013-11-26 SURGERY — ROBOTIC ASSISTED TOTAL HYSTERECTOMY WITH BILATERAL SALPINGO OOPHORECTOMY
Anesthesia: Choice | Site: Abdomen | Laterality: Bilateral

## 2013-11-26 NOTE — ED Notes (Signed)
GC/Chlamydia neg., Affirm: Candida and Trich neg., Gardnerella pos. Pt. adequately treated with Metrogel. Carrie Bryan 11/26/2013

## 2013-12-09 ENCOUNTER — Telehealth: Payer: Self-pay

## 2013-12-09 ENCOUNTER — Ambulatory Visit: Payer: Medicaid Other | Admitting: Obstetrics & Gynecology

## 2013-12-09 NOTE — Telephone Encounter (Signed)
Patient missed f/u appointment. Called patient. No answer. Left message stating "we are sorry you missed your follow up appointment, please call clinic to reschedule."

## 2013-12-10 ENCOUNTER — Encounter: Payer: Self-pay | Admitting: General Practice

## 2013-12-24 ENCOUNTER — Ambulatory Visit: Admit: 2013-12-24 | Payer: Self-pay | Admitting: Obstetrics & Gynecology

## 2013-12-24 SURGERY — ROBOTIC ASSISTED TOTAL HYSTERECTOMY WITH BILATERAL SALPINGO OOPHORECTOMY
Anesthesia: General | Laterality: Bilateral

## 2014-01-29 ENCOUNTER — Encounter: Payer: Medicaid Other | Admitting: Obstetrics & Gynecology

## 2014-02-12 ENCOUNTER — Encounter: Payer: Medicaid Other | Admitting: Obstetrics & Gynecology

## 2014-02-17 ENCOUNTER — Encounter (HOSPITAL_COMMUNITY): Payer: Self-pay | Admitting: Emergency Medicine

## 2014-02-19 ENCOUNTER — Encounter: Payer: Medicaid Other | Admitting: Obstetrics & Gynecology

## 2014-02-19 ENCOUNTER — Telehealth: Payer: Self-pay | Admitting: *Deleted

## 2014-02-19 NOTE — Telephone Encounter (Signed)
Attempted to contact patient, no answer, left message for patient to contact clinic to reschedule appointment.

## 2014-07-30 ENCOUNTER — Ambulatory Visit: Payer: Medicaid Other | Admitting: Obstetrics & Gynecology

## 2014-08-25 ENCOUNTER — Encounter: Payer: Self-pay | Admitting: Obstetrics & Gynecology

## 2014-08-25 ENCOUNTER — Other Ambulatory Visit (HOSPITAL_COMMUNITY)
Admission: RE | Admit: 2014-08-25 | Discharge: 2014-08-25 | Disposition: A | Discharge status: Home / Self Care | Payer: Medicaid Other | Source: Ambulatory Visit | Attending: Obstetrics & Gynecology | Admitting: Obstetrics & Gynecology

## 2014-08-25 ENCOUNTER — Ambulatory Visit (INDEPENDENT_AMBULATORY_CARE_PROVIDER_SITE_OTHER): Payer: Medicaid Other | Admitting: Obstetrics & Gynecology

## 2014-08-25 VITALS — BP 109/56 | HR 78 | Temp 97.5°F | Ht 64.0 in | Wt 215.8 lb

## 2014-08-25 DIAGNOSIS — D259 Leiomyoma of uterus, unspecified: Secondary | ICD-10-CM | POA: Diagnosis not present

## 2014-08-25 DIAGNOSIS — Z01419 Encounter for gynecological examination (general) (routine) without abnormal findings: Secondary | ICD-10-CM

## 2014-08-25 DIAGNOSIS — Z1151 Encounter for screening for human papillomavirus (HPV): Secondary | ICD-10-CM | POA: Insufficient documentation

## 2014-08-25 DIAGNOSIS — Z124 Encounter for screening for malignant neoplasm of cervix: Secondary | ICD-10-CM

## 2014-08-25 NOTE — Progress Notes (Signed)
Subjective:     Patient ID: Carrie Bryan, female   DOB: [birth year], 49 y.o.   MRN: [id number]  HPI Pt reports that her LMP was 4-5 months ago.  She was scheduled to get a hyst in Aug but, she got scared because heard that a 'hyst causes cancer' She reports that she is no longer bleeding but, she feels that her fibroids are bigger because she feels them pressing on her bladder.   She also c/o back pain that is getting worse as her fibroids get bigger. Her last PAP was in 2011  Past Medical History  Diagnosis Date  . Bacterial vaginosis   . Hypertension    Past Surgical History  Procedure Laterality Date  . Cholecystectomy     Current Outpatient Prescriptions on File Prior to Visit  Medication Sig Dispense Refill  . hydrochlorothiazide (HYDRODIURIL) 25 MG tablet Take 25 mg by mouth daily.       No current facility-administered medications on file prior to visit.   No Known Allergies    Review of Systems     Objective:   Physical Exam BP 109/56 mmHg  Pulse 78  Temp(Src) 97.5 F (36.4 C) (Oral)  Ht 5\' 4"  (1.626 m)  Wt 215 lb 12.8 oz (97.886 kg)  BMI 37.02 kg/m2 Pt in NAD Abd; obese, NT, ND.  Mass palpable to umbilicus GU: EGBUS: no lesions Vagina: no blood in vault Cervix: no lesion; no mucopurulent d/c Uterus: enlarged ~20 weeks sized.  Greatest length diverted to right side Adnexa: no masses; sl tender       Assessment:     Annual GYN exam Symptomatic uterine fibroids- pt wants definitive surgery but, she has been informed that she is no longer a candidate for RATH.  She is ok with an abd hyst. She wants the most cancer preventing surgery and we discussed a prophylactic salpingectomy which she requests.          Plan:     Mammogram- screening to be obtained F/u PAP  Patient desires surgical management with abdominal hysterectomy with bilateral salpingectomy.  The risks of surgery were discussed in detail with the patient including but not limited to: bleeding  which may require transfusion or reoperation; infection which may require prolonged hospitalization or re-hospitalization and antibiotic therapy; injury to bowel, bladder, ureters and major vessels or other surrounding organs; need for additional procedures including laparotomy; thromboembolic phenomenon, incisional problems and other postoperative or anesthesia complications.  Patient was told that the likelihood that her condition and symptoms will be treated effectively with this surgical management was very high; the postoperative expectations were also discussed in detail. The patient also understands the alternative treatment options which were discussed in full. All questions were answered.  She was told that she will be contacted by our surgical scheduler regarding the time and date of her surgery; routine preoperative instructions of having nothing to eat or drink after midnight on the day prior to surgery and also coming to the hospital 1 1/2 hours prior to her time of surgery were also emphasized.  She was told she may be called for a preoperative appointment about a week prior to surgery and will be given further preoperative instructions at that visit. Printed patient education handouts about the procedure were given to the patient to review at home.

## 2014-08-25 NOTE — Patient Instructions (Signed)
Abdominal Hysterectomy Abdominal hysterectomy is a surgical procedure to remove your womb (uterus). Your uterus is the muscular organ that contains a developing baby. This surgery is done for many reasons. You may need an abdominal hysterectomy if you have cancer, growths (tumors), long-term pain, or bleeding. You may also have this procedure if your uterus has slipped down into your vagina (uterine prolapse). Depending on why you need an abdominal hysterectomy, you may also have other reproductive organs removed. These could include the part of your vagina that connects with your uterus (cervix), the organs that make eggs (ovaries), and the tubes that connect the ovaries to the uterus (fallopian tubes). LET YOUR HEALTH CARE PROVIDER KNOW ABOUT:   Any allergies you have.  All medicines you are taking, including vitamins, herbs, eye drops, creams, and over-the-counter medicines.  Previous problems you or members of your family have had with the use of anesthetics.  Any blood disorders you have.  Previous surgeries you have had.  Medical conditions you have. RISKS AND COMPLICATIONS Generally, this is a safe procedure. However, as with any procedure, problems can occur. Infection is the most common problem after an abdominal hysterectomy. Other possible problems include:  Bleeding.  Formation of blood clots that may break free and travel to your lungs.  Injury to other organs near your uterus.  Nerve injury causing nerve pain.  Decreased interest in sex or pain during sexual intercourse. BEFORE THE PROCEDURE  Abdominal hysterectomy is a major surgical procedure. It can affect the way you feel about yourself. Talk to your health care provider about the physical and emotional changes hysterectomy may cause.  You may need to have blood work and X-rays done before surgery.  Quit smoking if you smoke. Ask your health care provider for help if you are struggling to quit.  Stop taking  medicines that thin your blood as directed by your health care provider.  You may be instructed to take antibiotic medicines or laxatives before surgery.  Do not eat or drink anything for 6-8 hours before surgery.  Take your regular medicines with a small sip of water.  Bathe or shower the night or morning before surgery. PROCEDURE  Abdominal hysterectomy is done in the operating room at the hospital.  In most cases, you will be given a medicine that makes you go to sleep (general anesthetic).  The surgeon will make a cut (incision) through the skin in your lower belly.  The incision may be about 5-7 inches long. It may go side-to-side or up-and-down.  The surgeon will move aside the body tissue that covers your uterus. The surgeon will then carefully take out your uterus along with any of your other reproductive organs that need to be removed.  Bleeding will be controlled with clamps or sutures.  The surgeon will close your incision with sutures or metal clips. AFTER THE PROCEDURE  You will have some pain immediately after the procedure.  You will be given pain medicine in the recovery room.  You will be taken to your hospital room when you have recovered from the anesthesia.  You may need to stay in the hospital for 2-5 days.  You will be given instructions for recovery at home. Document Released: 04/09/2013 Document Reviewed: 04/09/2013 [employer] Patient Information 2015 [employer]. This information is not intended to replace advice given to you by your health care provider. Make sure you discuss any questions you have with your health care provider.  

## 2014-08-26 ENCOUNTER — Telehealth: Payer: Self-pay

## 2014-08-26 DIAGNOSIS — Z01419 Encounter for gynecological examination (general) (routine) without abnormal findings: Secondary | ICD-10-CM

## 2014-08-26 LAB — CYTOLOGY - PAP

## 2014-08-26 NOTE — Telephone Encounter (Signed)
-----   Message from Lavonia Drafts, MD sent at 08/25/2014  4:40 PM EDT ----- Please schedule pt for mammogram- screening   Thx, clh-S

## 2014-08-26 NOTE — Telephone Encounter (Signed)
Attempted to contact patient. Both numbers listed invalid. Mammogram scheduled for Friday 09/26/14 1015. Will send letter.

## 2014-09-17 ENCOUNTER — Other Ambulatory Visit: Payer: Self-pay

## 2014-09-17 ENCOUNTER — Encounter (HOSPITAL_COMMUNITY)
Admission: RE | Admit: 2014-09-17 | Discharge: 2014-09-17 | Disposition: A | Discharge status: Home / Self Care | Payer: Medicaid Other | Source: Ambulatory Visit | Attending: Obstetrics & Gynecology | Admitting: Obstetrics & Gynecology

## 2014-09-17 ENCOUNTER — Encounter (HOSPITAL_COMMUNITY): Payer: Self-pay

## 2014-09-17 DIAGNOSIS — Z01818 Encounter for other preprocedural examination: Secondary | ICD-10-CM | POA: Insufficient documentation

## 2014-09-17 HISTORY — DX: Depression, unspecified: F32.A

## 2014-09-17 HISTORY — DX: Major depressive disorder, single episode, unspecified: F32.9

## 2014-09-17 HISTORY — DX: Anxiety disorder, unspecified: F41.9

## 2014-09-17 LAB — CBC
HEMATOCRIT: 36.5 % (ref 36.0–46.0)
HEMOGLOBIN: 12.8 g/dL (ref 12.0–15.0)
MCH: 32.7 pg (ref 26.0–34.0)
MCHC: 35.1 g/dL (ref 30.0–36.0)
MCV: 93.1 fL (ref 78.0–100.0)
Platelets: 222 10*3/uL (ref 150–400)
RBC: 3.92 MIL/uL (ref 3.87–5.11)
RDW: 13 % (ref 11.5–15.5)
WBC: 4.5 10*3/uL (ref 4.0–10.5)

## 2014-09-17 LAB — BASIC METABOLIC PANEL
Anion gap: 6 (ref 5–15)
BUN: 11 mg/dL (ref 6–20)
CO2: 28 mmol/L (ref 22–32)
Calcium: 8.7 mg/dL — ABNORMAL LOW (ref 8.9–10.3)
Chloride: 105 mmol/L (ref 101–111)
Creatinine, Ser: 1 mg/dL (ref 0.44–1.00)
GFR calc Af Amer: >60 mL/min (ref >60)
GLUCOSE: 93 mg/dL (ref 65–99)
Potassium: 3 mmol/L — ABNORMAL LOW (ref 3.5–5.1)
Sodium: 139 mmol/L (ref 135–145)

## 2014-09-17 NOTE — Pre-Procedure Instructions (Signed)
Dr. Jillyn Hidden made aware of pt's K+ result and she is "ok" with it. She does not want a repeat test done on DOS.

## 2014-09-17 NOTE — Patient Instructions (Signed)
Your procedure is scheduled on:09/23/14  Enter through the Main Entrance at :8:30 am Pick up desk phone and dial [phone] and inform us of your arrival.  Please call [phone] if you have any problems the morning of surgery.  Remember: Do not eat foods or drink liquids after midnight on Monday 09/22/14  You may brush your teeth the morning of surgery.  Take these meds the morning of surgery with a sip of water: BP med and Sertraline  DO NOT wear jewelry, eye make-up, lipstick,body lotion, or dark fingernail polish.  (Polished toes are ok) You may wear deodorant.  If you are to be admitted after surgery, leave suitcase in car until your room has been assigned. Patients discharged on the day of surgery will not be allowed to drive home. Wear loose fitting, comfortable clothes for your ride home.

## 2014-09-18 ENCOUNTER — Other Ambulatory Visit: Payer: Self-pay | Admitting: Obstetrics & Gynecology

## 2014-09-18 DIAGNOSIS — E876 Hypokalemia: Secondary | ICD-10-CM

## 2014-09-18 MED ORDER — POTASSIUM CHLORIDE CRYS ER 20 MEQ PO TBCR: 40.0000 meq | EXTENDED_RELEASE_TABLET | Freq: Every day | ORAL | Status: DC

## 2014-09-18 NOTE — Progress Notes (Signed)
Called patient and informed her of need to start and take KCL 2 tabs x 4 days. Informed her RX is at pharmacy and she should pick it up today and begin. Patient verbalized understanding. No questions or concerns.

## 2014-09-20 NOTE — Anesthesia Preprocedure Evaluation (Addendum)
Anesthesia Evaluation  Patient identified by MRN, date of birth, ID band Patient awake    Reviewed: Allergy & Precautions, NPO status , Patient's Chart, lab work & pertinent test results, reviewed documented beta blocker date and time   Airway Mallampati: II   Neck ROM: Full    Dental  (+) Missing, Dental Advisory Given   Pulmonary neg pulmonary ROS,  breath sounds clear to auscultation        Cardiovascular hypertension, Pt. on medications Rhythm:Regular  EKG 1st degree AV block   Neuro/Psych Anxiety Depression    GI/Hepatic negative GI ROS, Neg liver ROS,   Endo/Other  Morbid obesity  Renal/GU negative Renal ROS     Musculoskeletal   Abdominal (+)  Abdomen: soft.    Peds  Hematology 12/36   Anesthesia Other Findings K+ 3.0, was 3.3 1 year ago, on K+ supplement, on HCTZ, if not symptomatic OK to proceed,  K+ today 3.3  Reproductive/Obstetrics                           Anesthesia Physical Anesthesia Plan  ASA: II  Anesthesia Plan: General   Post-op Pain Management:    Induction: Intravenous  Airway Management Planned: Oral ETT  Additional Equipment:   Intra-op Plan:   Post-operative Plan: Extubation in OR  Informed Consent: I have reviewed the patients History and Physical, chart, labs and discussed the procedure including the risks, benefits and alternatives for the proposed anesthesia with the patient or authorized representative who has indicated his/her understanding and acceptance.     Plan Discussed with:   Anesthesia Plan Comments:         Anesthesia Quick Evaluation

## 2014-09-23 ENCOUNTER — Inpatient Hospital Stay (HOSPITAL_COMMUNITY): Payer: Medicaid Other | Admitting: Anesthesiology

## 2014-09-23 ENCOUNTER — Inpatient Hospital Stay (HOSPITAL_COMMUNITY)
Admission: RE | Admit: 2014-09-23 | Discharge: 2014-09-25 | DRG: 743 | Disposition: A | Discharge status: Home / Self Care | Payer: Medicaid Other | Source: Ambulatory Visit | Attending: Obstetrics & Gynecology | Admitting: Obstetrics & Gynecology

## 2014-09-23 ENCOUNTER — Encounter (HOSPITAL_COMMUNITY)
Admission: RE | Disposition: A | Discharge status: Home / Self Care | Payer: Self-pay | Source: Ambulatory Visit | Attending: Obstetrics & Gynecology

## 2014-09-23 ENCOUNTER — Encounter (HOSPITAL_COMMUNITY): Payer: Self-pay

## 2014-09-23 DIAGNOSIS — Z9049 Acquired absence of other specified parts of digestive tract: Secondary | ICD-10-CM | POA: Diagnosis present

## 2014-09-23 DIAGNOSIS — F419 Anxiety disorder, unspecified: Secondary | ICD-10-CM | POA: Diagnosis present

## 2014-09-23 DIAGNOSIS — Z9889 Other specified postprocedural states: Secondary | ICD-10-CM

## 2014-09-23 DIAGNOSIS — Z302 Encounter for sterilization: Secondary | ICD-10-CM

## 2014-09-23 DIAGNOSIS — I10 Essential (primary) hypertension: Secondary | ICD-10-CM | POA: Diagnosis present

## 2014-09-23 DIAGNOSIS — D259 Leiomyoma of uterus, unspecified: Secondary | ICD-10-CM | POA: Diagnosis present

## 2014-09-23 DIAGNOSIS — R938 Abnormal findings on diagnostic imaging of other specified body structures: Secondary | ICD-10-CM | POA: Diagnosis present

## 2014-09-23 DIAGNOSIS — D219 Benign neoplasm of connective and other soft tissue, unspecified: Secondary | ICD-10-CM

## 2014-09-23 DIAGNOSIS — E876 Hypokalemia: Secondary | ICD-10-CM

## 2014-09-23 DIAGNOSIS — F329 Major depressive disorder, single episode, unspecified: Secondary | ICD-10-CM | POA: Diagnosis present

## 2014-09-23 DIAGNOSIS — Z79899 Other long term (current) drug therapy: Secondary | ICD-10-CM | POA: Diagnosis not present

## 2014-09-23 HISTORY — PX: BILATERAL SALPINGECTOMY: SHX5743

## 2014-09-23 HISTORY — PX: ABDOMINAL HYSTERECTOMY: SHX81

## 2014-09-23 LAB — TYPE AND SCREEN
ABO/RH(D): A POS
ANTIBODY SCREEN: NEGATIVE

## 2014-09-23 LAB — PREGNANCY, URINE: Preg Test, Ur: NEGATIVE

## 2014-09-23 LAB — ABO/RH: ABO/RH(D): A POS

## 2014-09-23 LAB — POTASSIUM: POTASSIUM: 3.3 mmol/L — AB (ref 3.5–5.1)

## 2014-09-23 SURGERY — HYSTERECTOMY, ABDOMINAL
Anesthesia: General | Site: Abdomen

## 2014-09-23 MED ORDER — FENTANYL CITRATE (PF) 100 MCG/2ML IJ SOLN
INTRAMUSCULAR | Status: DC | PRN
  Administered 2014-09-23: 50 ug via INTRAVENOUS
  Administered 2014-09-23 (×2): 100 ug via INTRAVENOUS

## 2014-09-23 MED ORDER — GLYCOPYRROLATE 0.2 MG/ML IJ SOLN
INTRAMUSCULAR | Status: DC | PRN
  Administered 2014-09-23: 0.6 mg via INTRAVENOUS

## 2014-09-23 MED ORDER — DOCUSATE SODIUM 100 MG PO CAPS
100.0000 mg | ORAL_CAPSULE | Freq: Two times a day (BID) | ORAL | Status: DC
  Administered 2014-09-23 – 2014-09-24 (×3): 100 mg via ORAL
  Filled 2014-09-23 (×3): qty 1

## 2014-09-23 MED ORDER — 0.9 % SODIUM CHLORIDE (POUR BTL) OPTIME
TOPICAL | Status: DC | PRN
  Administered 2014-09-23: 1000 mL

## 2014-09-23 MED ORDER — ROCURONIUM BROMIDE 100 MG/10ML IV SOLN
INTRAVENOUS | Status: DC | PRN
  Administered 2014-09-23: 10 mg via INTRAVENOUS
  Administered 2014-09-23: 50 mg via INTRAVENOUS
  Administered 2014-09-23: 10 mg via INTRAVENOUS

## 2014-09-23 MED ORDER — ONDANSETRON HCL 4 MG/2ML IJ SOLN
INTRAMUSCULAR | Status: DC | PRN
  Administered 2014-09-23: 4 mg via INTRAVENOUS

## 2014-09-23 MED ORDER — LACTATED RINGERS IV SOLN
INTRAVENOUS | Status: DC
  Administered 2014-09-23 (×3): via INTRAVENOUS

## 2014-09-23 MED ORDER — SERTRALINE HCL 25 MG PO TABS
25.0000 mg | ORAL_TABLET | Freq: Every day | ORAL | Status: DC
  Administered 2014-09-24: 25 mg via ORAL
  Filled 2014-09-23 (×3): qty 1

## 2014-09-23 MED ORDER — PROMETHAZINE HCL 25 MG/ML IJ SOLN: 6.2500 mg | INTRAMUSCULAR | Status: DC | PRN

## 2014-09-23 MED ORDER — MIDAZOLAM HCL 2 MG/2ML IJ SOLN
INTRAMUSCULAR | Status: AC
  Filled 2014-09-23: qty 2

## 2014-09-23 MED ORDER — HYDROMORPHONE 0.3 MG/ML IV SOLN
INTRAVENOUS | Status: DC
  Administered 2014-09-23: 1.79 mg via INTRAVENOUS
  Administered 2014-09-23: 16:00:00 via INTRAVENOUS
  Administered 2014-09-24: 0.2 mg via INTRAVENOUS
  Filled 2014-09-23: qty 25

## 2014-09-23 MED ORDER — ROCURONIUM BROMIDE 100 MG/10ML IV SOLN
INTRAVENOUS | Status: AC
  Filled 2014-09-23: qty 1

## 2014-09-23 MED ORDER — MENTHOL 3 MG MT LOZG: 1.0000 | LOZENGE | OROMUCOSAL | Status: DC | PRN

## 2014-09-23 MED ORDER — ONDANSETRON HCL 4 MG/2ML IJ SOLN: 4.0000 mg | Freq: Four times a day (QID) | INTRAMUSCULAR | Status: DC | PRN

## 2014-09-23 MED ORDER — NALOXONE HCL 0.4 MG/ML IJ SOLN: 0.4000 mg | INTRAMUSCULAR | Status: DC | PRN

## 2014-09-23 MED ORDER — EPHEDRINE 5 MG/ML INJ
INTRAVENOUS | Status: AC
  Filled 2014-09-23: qty 10

## 2014-09-23 MED ORDER — HYDROCHLOROTHIAZIDE 25 MG PO TABS
25.0000 mg | ORAL_TABLET | Freq: Every day | ORAL | Status: DC
  Filled 2014-09-23 (×2): qty 1

## 2014-09-23 MED ORDER — ONDANSETRON HCL 4 MG PO TABS: 4.0000 mg | ORAL_TABLET | Freq: Four times a day (QID) | ORAL | Status: DC | PRN

## 2014-09-23 MED ORDER — DIPHENHYDRAMINE HCL 12.5 MG/5ML PO ELIX: 12.5000 mg | ORAL_SOLUTION | Freq: Four times a day (QID) | ORAL | Status: DC | PRN

## 2014-09-23 MED ORDER — FLEET ENEMA 7-19 GM/118ML RE ENEM: 1.0000 | ENEMA | Freq: Once | RECTAL | Status: AC | PRN

## 2014-09-23 MED ORDER — KCL IN DEXTROSE-NACL 40-5-0.45 MEQ/L-%-% IV SOLN: INTRAVENOUS | Status: DC

## 2014-09-23 MED ORDER — PROPOFOL 10 MG/ML IV BOLUS
INTRAVENOUS | Status: AC
  Filled 2014-09-23: qty 20

## 2014-09-23 MED ORDER — EPHEDRINE SULFATE 50 MG/ML IJ SOLN
INTRAMUSCULAR | Status: DC | PRN
  Administered 2014-09-23 (×2): 10 mg via INTRAVENOUS

## 2014-09-23 MED ORDER — DEXAMETHASONE SODIUM PHOSPHATE 4 MG/ML IJ SOLN
INTRAMUSCULAR | Status: AC
  Filled 2014-09-23: qty 1

## 2014-09-23 MED ORDER — NEOSTIGMINE METHYLSULFATE 10 MG/10ML IV SOLN
INTRAVENOUS | Status: AC
  Filled 2014-09-23: qty 1

## 2014-09-23 MED ORDER — BISACODYL 10 MG RE SUPP: 10.0000 mg | Freq: Every day | RECTAL | Status: DC | PRN

## 2014-09-23 MED ORDER — PROPOFOL 10 MG/ML IV BOLUS
INTRAVENOUS | Status: DC | PRN
  Administered 2014-09-23: 150 mg via INTRAVENOUS

## 2014-09-23 MED ORDER — ZOLPIDEM TARTRATE 5 MG PO TABS: 5.0000 mg | ORAL_TABLET | Freq: Every evening | ORAL | Status: DC | PRN

## 2014-09-23 MED ORDER — FENTANYL CITRATE (PF) 100 MCG/2ML IJ SOLN
INTRAMUSCULAR | Status: AC
  Filled 2014-09-23: qty 2

## 2014-09-23 MED ORDER — MEPERIDINE HCL 25 MG/ML IJ SOLN: 6.2500 mg | INTRAMUSCULAR | Status: DC | PRN

## 2014-09-23 MED ORDER — POLYETHYLENE GLYCOL 3350 17 G PO PACK: 17.0000 g | PACK | Freq: Every day | ORAL | Status: DC | PRN

## 2014-09-23 MED ORDER — SIMETHICONE 80 MG PO CHEW: 80.0000 mg | CHEWABLE_TABLET | Freq: Four times a day (QID) | ORAL | Status: DC | PRN

## 2014-09-23 MED ORDER — KETOROLAC TROMETHAMINE 30 MG/ML IJ SOLN
INTRAMUSCULAR | Status: DC | PRN
  Administered 2014-09-23: 30 mg via INTRAVENOUS

## 2014-09-23 MED ORDER — SCOPOLAMINE 1 MG/3DAYS TD PT72
MEDICATED_PATCH | TRANSDERMAL | Status: AC
  Administered 2014-09-23: 1.5 mg via TRANSDERMAL
  Filled 2014-09-23: qty 1

## 2014-09-23 MED ORDER — KETOROLAC TROMETHAMINE 30 MG/ML IJ SOLN
30.0000 mg | Freq: Three times a day (TID) | INTRAMUSCULAR | Status: AC
  Administered 2014-09-23 – 2014-09-24 (×3): 30 mg via INTRAVENOUS
  Filled 2014-09-23 (×3): qty 1

## 2014-09-23 MED ORDER — BUPIVACAINE HCL (PF) 0.5 % IJ SOLN
INTRAMUSCULAR | Status: AC
Start: 2014-09-23 — End: 2014-09-23
  Filled 2014-09-23: qty 30

## 2014-09-23 MED ORDER — MIDAZOLAM HCL 2 MG/2ML IJ SOLN
INTRAMUSCULAR | Status: DC | PRN
  Administered 2014-09-23: 2 mg via INTRAVENOUS

## 2014-09-23 MED ORDER — LACTATED RINGERS IV SOLN: INTRAVENOUS | Status: DC

## 2014-09-23 MED ORDER — KETOROLAC TROMETHAMINE 30 MG/ML IJ SOLN: 30.0000 mg | Freq: Three times a day (TID) | INTRAMUSCULAR | Status: AC

## 2014-09-23 MED ORDER — LIDOCAINE HCL (CARDIAC) 20 MG/ML IV SOLN
INTRAVENOUS | Status: AC
  Filled 2014-09-23: qty 5

## 2014-09-23 MED ORDER — DIPHENHYDRAMINE HCL 50 MG/ML IJ SOLN: 12.5000 mg | Freq: Four times a day (QID) | INTRAMUSCULAR | Status: DC | PRN

## 2014-09-23 MED ORDER — KETOROLAC TROMETHAMINE 30 MG/ML IJ SOLN
INTRAMUSCULAR | Status: AC
  Filled 2014-09-23: qty 1

## 2014-09-23 MED ORDER — FENTANYL CITRATE (PF) 250 MCG/5ML IJ SOLN
INTRAMUSCULAR | Status: AC
  Filled 2014-09-23: qty 5

## 2014-09-23 MED ORDER — LIDOCAINE HCL (CARDIAC) 20 MG/ML IV SOLN
INTRAVENOUS | Status: DC | PRN
  Administered 2014-09-23: 100 mg via INTRAVENOUS

## 2014-09-23 MED ORDER — GLYCOPYRROLATE 0.2 MG/ML IJ SOLN
INTRAMUSCULAR | Status: AC
  Filled 2014-09-23: qty 3

## 2014-09-23 MED ORDER — OXYCODONE-ACETAMINOPHEN 5-325 MG PO TABS
1.0000 | ORAL_TABLET | ORAL | Status: DC | PRN
  Administered 2014-09-24 (×3): 1 via ORAL
  Filled 2014-09-23 (×3): qty 1

## 2014-09-23 MED ORDER — CEFAZOLIN SODIUM-DEXTROSE 2-3 GM-% IV SOLR
2.0000 g | INTRAVENOUS | Status: AC
  Administered 2014-09-23: 2 g via INTRAVENOUS

## 2014-09-23 MED ORDER — ONDANSETRON HCL 4 MG/2ML IJ SOLN
INTRAMUSCULAR | Status: AC
  Filled 2014-09-23: qty 2

## 2014-09-23 MED ORDER — IBUPROFEN 600 MG PO TABS
600.0000 mg | ORAL_TABLET | Freq: Four times a day (QID) | ORAL | Status: DC | PRN
  Administered 2014-09-24: 600 mg via ORAL
  Filled 2014-09-23: qty 1

## 2014-09-23 MED ORDER — CEFAZOLIN SODIUM-DEXTROSE 2-3 GM-% IV SOLR
INTRAVENOUS | Status: AC
  Filled 2014-09-23: qty 50

## 2014-09-23 MED ORDER — SODIUM CHLORIDE 0.9 % IJ SOLN: 9.0000 mL | INTRAMUSCULAR | Status: DC | PRN

## 2014-09-23 MED ORDER — POTASSIUM CHLORIDE 2 MEQ/ML IV SOLN
INTRAVENOUS | Status: DC
  Administered 2014-09-23 – 2014-09-24 (×2): via INTRAVENOUS
  Filled 2014-09-23 (×3): qty 1000

## 2014-09-23 MED ORDER — DEXAMETHASONE SODIUM PHOSPHATE 10 MG/ML IJ SOLN
INTRAMUSCULAR | Status: DC | PRN
  Administered 2014-09-23: 4 mg via INTRAVENOUS

## 2014-09-23 MED ORDER — PANTOPRAZOLE SODIUM 40 MG PO TBEC
40.0000 mg | DELAYED_RELEASE_TABLET | Freq: Every day | ORAL | Status: DC
  Administered 2014-09-24: 40 mg via ORAL
  Filled 2014-09-23: qty 1

## 2014-09-23 MED ORDER — NEOSTIGMINE METHYLSULFATE 10 MG/10ML IV SOLN
INTRAVENOUS | Status: DC | PRN
  Administered 2014-09-23: 4 mg via INTRAVENOUS

## 2014-09-23 MED ORDER — SCOPOLAMINE 1 MG/3DAYS TD PT72
1.0000 | MEDICATED_PATCH | Freq: Once | TRANSDERMAL | Status: DC
  Administered 2014-09-23: 1.5 mg via TRANSDERMAL

## 2014-09-23 MED ORDER — SODIUM CHLORIDE 0.9 % IJ SOLN
INTRAMUSCULAR | Status: AC
  Filled 2014-09-23: qty 50

## 2014-09-23 MED ORDER — FENTANYL CITRATE (PF) 100 MCG/2ML IJ SOLN
25.0000 ug | INTRAMUSCULAR | Status: DC | PRN
  Administered 2014-09-23: 50 ug via INTRAVENOUS
  Administered 2014-09-23: 25 ug via INTRAVENOUS
  Administered 2014-09-23: 50 ug via INTRAVENOUS

## 2014-09-23 MED ORDER — BUPIVACAINE HCL (PF) 0.5 % IJ SOLN
INTRAMUSCULAR | Status: DC | PRN
  Administered 2014-09-23: 30 mL

## 2014-09-23 SURGICAL SUPPLY — 40 items
APL SKNCLS STERI-STRIP NONHPOA (GAUZE/BANDAGES/DRESSINGS) ×2
BENZOIN TINCTURE PRP APPL 2/3 (GAUZE/BANDAGES/DRESSINGS) ×1 IMPLANT
BLADE SURG 10 STRL SS (BLADE) ×1 IMPLANT
CANISTER SUCT 3000ML (MISCELLANEOUS) ×3 IMPLANT
CLOTH BEACON ORANGE TIMEOUT ST (SAFETY) ×3 IMPLANT
CONT PATH 16OZ SNAP LID 3702 (MISCELLANEOUS) ×3 IMPLANT
DRSG OPSITE POSTOP 4X12 (GAUZE/BANDAGES/DRESSINGS) ×1 IMPLANT
DURAPREP 26ML APPLICATOR (WOUND CARE) ×3 IMPLANT
ELECT LIGASURE SHORT 9 REUSE (ELECTRODE) ×1 IMPLANT
GAUZE SPONGE 4X4 12PLY STRL (GAUZE/BANDAGES/DRESSINGS) ×3 IMPLANT
GAUZE SPONGE 4X4 16PLY XRAY LF (GAUZE/BANDAGES/DRESSINGS) ×4 IMPLANT
GLOVE BIO SURGEON STRL SZ7 (GLOVE) ×3 IMPLANT
GLOVE BIOGEL PI IND STRL 7.0 (GLOVE) ×6 IMPLANT
GLOVE BIOGEL PI INDICATOR 7.0 (GLOVE) ×3
GOWN STRL REUS W/TWL LRG LVL3 (GOWN DISPOSABLE) ×6 IMPLANT
NEEDLE HYPO 22GX1.5 SAFETY (NEEDLE) ×3 IMPLANT
NS IRRIG 1000ML POUR BTL (IV SOLUTION) ×4 IMPLANT
PACK ABDOMINAL GYN (CUSTOM PROCEDURE TRAY) ×3 IMPLANT
PAD ABD 7.5X8 STRL (GAUZE/BANDAGES/DRESSINGS) ×5 IMPLANT
PAD OB MATERNITY 4.3X12.25 (PERSONAL CARE ITEMS) ×3 IMPLANT
PROTECTOR NERVE ULNAR (MISCELLANEOUS) ×3 IMPLANT
SPONGE GAUZE 4X4 12PLY STER LF (GAUZE/BANDAGES/DRESSINGS) ×1 IMPLANT
SPONGE LAP 18X18 X RAY DECT (DISPOSABLE) ×8 IMPLANT
STAPLER VISISTAT 35W (STAPLE) ×3 IMPLANT
STRIP CLOSURE SKIN 1/2X4 (GAUZE/BANDAGES/DRESSINGS) ×1 IMPLANT
SUT PLAIN 2 0 XLH (SUTURE) ×1 IMPLANT
SUT VIC AB 0 CT1 18XCR BRD8 (SUTURE) ×6 IMPLANT
SUT VIC AB 0 CT1 27 (SUTURE) ×6
SUT VIC AB 0 CT1 27XBRD ANBCTR (SUTURE) ×4 IMPLANT
SUT VIC AB 0 CT1 36 (SUTURE) ×3 IMPLANT
SUT VIC AB 0 CT1 8-18 (SUTURE) ×12
SUT VIC AB 3-0 CT1 27 (SUTURE) ×3
SUT VIC AB 3-0 CT1 TAPERPNT 27 (SUTURE) ×2 IMPLANT
SUT VIC AB 3-0 PS2 18 (SUTURE) ×1 IMPLANT
SUT VICRYL 0 TIES 12 18 (SUTURE) ×3 IMPLANT
SYR CONTROL 10ML LL (SYRINGE) ×3 IMPLANT
TAPE CLOTH SURG 4X10 WHT LF (GAUZE/BANDAGES/DRESSINGS) ×1 IMPLANT
TOWEL OR 17X24 6PK STRL BLUE (TOWEL DISPOSABLE) ×6 IMPLANT
TRAY FOLEY CATH SILVER 14FR (SET/KITS/TRAYS/PACK) ×3 IMPLANT
WATER STERILE IRR 1000ML POUR (IV SOLUTION) ×3 IMPLANT

## 2014-09-23 NOTE — Brief Op Note (Signed)
09/23/2014  12:01 PM  PATIENT:  Carrie Bryan  49 y.o. female  PRE-OPERATIVE DIAGNOSIS:  cpt [phone] - Symptomatic uterine fibroids and undesired fertility  POST-OPERATIVE DIAGNOSIS:  pt [id number] - Symptomatic uterine fibroids and undesired fertility  PROCEDURE:  Procedure(s) with comments: HYSTERECTOMY ABDOMINAL (N/A) - Requested 09/23/14 @ 10:00a BILATERAL SALPINGECTOMY (Bilateral)  SURGEON:  Surgeon(s) and Role:    * Lavonia Drafts, MD - Primary    * Woodroe Mode, MD - Assisting  ANESTHESIA:   general  EBL:  Total I/O In: 2500 [I.V.:2500] Out: 1000 [Blood:1000]  BLOOD ADMINISTERED:none  DRAINS: none   LOCAL MEDICATIONS USED:  MARCAINE     SPECIMEN:  Source of Specimen:  uterus, fallopian tubes and cervix  DISPOSITION OF SPECIMEN:  PATHOLOGY  COUNTS:  YES  TOURNIQUET:  * No tourniquets in log *  DICTATION: .Note written in EPIC  PLAN OF CARE: Admit to inpatient   PATIENT DISPOSITION:  PACU - hemodynamically stable.   Delay start of Pharmacological VTE agent (>24hrs) due to surgical blood loss or risk of bleeding: yes  Complications : none immediate

## 2014-09-23 NOTE — Op Note (Signed)
09/23/2014  12:01 PM  PATIENT:  Carrie Bryan  49 y.o. female  PRE-OPERATIVE DIAGNOSIS:  cpt [phone] - Symptomatic uterine fibroids and undesired fertility  POST-OPERATIVE DIAGNOSIS:  pt 58150 - Symptomatic uterine fibroids and undesired fertility  PROCEDURE:  Procedure(s) with comments: HYSTERECTOMY ABDOMINAL (N/A) - Requested 09/23/14 @ 10:00a BILATERAL SALPINGECTOMY (Bilateral)  SURGEON:  Surgeon(s) and Role:    * Lavonia Drafts, MD - Primary    * Woodroe Mode, MD - Assisting  ANESTHESIA:   general  EBL:  Total I/O In: 2500 [I.V.:2500] Out: 1000 [Blood:1000]  BLOOD ADMINISTERED:none  DRAINS: none   LOCAL MEDICATIONS USED:  MARCAINE     SPECIMEN:  Source of Specimen:  uterus, fallopian tubes and cervix  DISPOSITION OF SPECIMEN:  PATHOLOGY  COUNTS:  YES  TOURNIQUET:  * No tourniquets in log *  DICTATION: .Note written in EPIC  PLAN OF CARE: Admit to inpatient   PATIENT DISPOSITION:  PACU - hemodynamically stable.   Delay start of Pharmacological VTE agent (>24hrs) due to surgical blood loss or risk of bleeding: yes  Complications : none immediate     INDICATIONS: The patient is a 49 yo [id number] with the aforementioned diagnoses who desires definitive surgical management. On the preoperative visit, the risks, benefits, indications, and alternatives of the procedure were reviewed with the patient. On the day of surgery, the risks of surgery were again discussed with the patient including but not limited to: bleeding which may require transfusion or reoperation; infection which may require antibiotics; injury to bowel, bladder, ureters or other surrounding organs; need for additional procedures; thromboembolic phenomenon, incisional problems and other postoperative/anesthesia complications. Written informed consent was obtained.  OPERATIVE FINDINGS: A 20 week sized fibroids uterus with fibroids and multiple smaller fiboids with normal tubes and ovaries  bilaterally.   SPECIMENS: Uterus,cervix, bilateral fallopian tubes sent to pathology   DESCRIPTION OF PROCEDURE: The patient received intravenous antibiotics and had sequential compression devices applied to her lower extremities while in the preoperative area. She was taken to the operating room and placed under general anesthesia without difficulty. The abdomen and perineum were prepped and draped in a sterile manner, and she was placed in a dorsal supine position. A Foley catheter was inserted into the bladder and attached to constant drainage. After an adequate timeout was performed, a transverse skin incision was made. This incision was taken down to the fascia using a scalpel and cautery with care given to maintain good hemostasis. The fascia was grasped with kocher clamps, tented up and the rectus muscles were dissected off using sharp and blunt dissection on the superior and inferior aspects of the fascial incision. The peritoneum entered sharply without complication. This peritoneal cavity was entered digitally. Attention was then turned to the pelvis. The uterus at this point was noted to be mobilized and was delivered up out of the abdomen. The bowel was packed away with moist laparotomy sponges. The round ligaments on each side were clamped, suture ligated with 0 Vicryl, and transected with electrocautery allowing entry into the broad ligament. Of note, all sutures used in this procedure are 0 Vicryl unless otherwise noted. The anterior and posterior leaves of the broad ligament were separated, and the ureters were inspected to be safely away from the area of dissection bilaterally. The uteroovarian ligaments were bliaterally clamped with the Ligasure and transected. The uterine arteries were skeletinized bilaterally A bladder flap was then created. The bladder was then bluntly dissected off the lower uterine segment and  cervix with good hemostasis noted. The uterine arteries were then skeletonized  bilaterally and then clamped, cut, and doubly suture ligated with care given to prevent ureteral injury. After the uterine arteries were clamped, the uterus was removed with a scalpel and the cervical stump was grasped with a Jacob's tenaculum. The uterosacral ligaments were then clamped, cut, and ligated bilaterally. Finally, the cardinal ligaments were clamped, cut, and ligated bilaterally. The uterus was removed.  The cuff angles were closed with multiple figure-of-eight sutures.   At this point the fallopian tubes were grasped with a Babcock clamp.  Using a kelly clamp the mesosalpinx was clamped cut and suture ligated, excellent hemostasis was noted.  The pelvis was irrigated and hemostasis was reconfirmed at all pedicles and along the pelvic sidewall.  All laparotomy sponges and instruments were removed from the abdomen.   The peritoneum was reapproximated with 0 vicryl.  The fascia was closed with 0 vicryl . The subcutaneous fascia was closed with 3-0 vicryl and the skin was a subcuticular suture of 3-0 vicryl. The incision was injected with 30cc of 0.25% Marcaine. Benzoin and steristrips were applied.  Sponge, lap and needle counts were correct times two. The patient was taken to the recovery area awake, extubated and in stable condition.

## 2014-09-23 NOTE — Anesthesia Postprocedure Evaluation (Signed)
  Anesthesia Post-op Note  Patient: Carrie Bryan  Procedure(s) Performed: Procedure(s) with comments: HYSTERECTOMY ABDOMINAL (N/A) - Requested 09/23/14 @ 10:00a BILATERAL SALPINGECTOMY (Bilateral)  Patient Location: PACU  Anesthesia Type:General  Level of Consciousness: awake  Airway and Oxygen Therapy: Patient Spontanous Breathing  Post-op Pain: mild  Post-op Assessment: Post-op Vital signs reviewed  Post-op Vital Signs: Reviewed and stable  Last Vitals:  Filed Vitals:   09/23/14 1245  BP: 127/64  Pulse: 73  Temp:   Resp: 16    Complications: No apparent anesthesia complications

## 2014-09-23 NOTE — Transfer of Care (Signed)
Immediate Anesthesia Transfer of Care Note  Patient: Carrie Bryan  Procedure(s) Performed: Procedure(s) with comments: HYSTERECTOMY ABDOMINAL (N/A) - Requested 09/23/14 @ 10:00a BILATERAL SALPINGECTOMY (Bilateral)  Patient Location: PACU  Anesthesia Type:General  Level of Consciousness: awake and alert   Airway & Oxygen Therapy: Patient Spontanous Breathing and Patient connected to nasal cannula oxygen  Post-op Assessment: Report given to RN and Post -op Vital signs reviewed and stable  Post vital signs: Reviewed and stable  Last Vitals:  Filed Vitals:   09/23/14 0940  BP: 138/77  Pulse: 81  Temp: 36.6 C  Resp: 16    Complications: No apparent anesthesia complications

## 2014-09-23 NOTE — Anesthesia Procedure Notes (Signed)
Procedure Name: Intubation Date/Time: 09/23/2014 10:13 AM Performed by: Ty Buntrock, Sheron Nightingale Pre-anesthesia Checklist: Patient identified, Emergency Drugs available, Timeout performed and Suction available Patient Re-evaluated:Patient Re-evaluated prior to inductionOxygen Delivery Method: Circle system utilized Preoxygenation: Pre-oxygenation with 100% oxygen Intubation Type: IV induction Ventilation: Mask ventilation without difficulty Laryngoscope Size: Mac and 3 Grade View: Grade I Tube size: 7.0 mm Number of attempts: 1 Placement Confirmation: ETT inserted through vocal cords under direct vision and positive ETCO2 Secured at: 21 cm Dental Injury: Teeth and Oropharynx as per pre-operative assessment

## 2014-09-23 NOTE — H&P (Signed)
Preoperative History and Physical  Carrie Bryan is a 49 y.o. [id number] here for surgical management of symptomatic uterine fibroids.   Proposed surgery: total abdominal hysterectomy with bilateral salpingectomy  Past Medical History  Diagnosis Date  . Bacterial vaginosis   . Hypertension   . Depression   . Anxiety    Past Surgical History  Procedure Laterality Date  . Cholecystectomy    . Dilation and curettage of uterus     OB History    Gravida Para Term Preterm AB TAB SAB Ectopic Multiple Living   10 3 3  7 6 1   3      Patient denies any cervical dysplasia or STIs. Prescriptions prior to admission  Medication Sig Dispense Refill Last Dose  . hydrochlorothiazide (HYDRODIURIL) 25 MG tablet Take 25 mg by mouth daily.     Taking  . potassium chloride SA (K-DUR,KLOR-CON) 20 MEQ tablet Take 2 tablets (40 mEq total) by mouth daily. 2 tablets daily for 4 days 8 tablet 0   . sertraline (ZOLOFT) 25 MG tablet Take 25 mg by mouth daily.   Taking    No Known Allergies Social History:   reports that she has never smoked. She has never used smokeless tobacco. She reports that she does not drink alcohol or use illicit drugs. No family history on file.  Review of Systems: Noncontributory  PHYSICAL EXAM: BP 138/77 mmHg  Pulse 81  Temp(Src) 97.9 F (36.6 C) (Oral)  Resp 16  SpO2 100%  General appearance - alert, well appearing, and in no distress Chest - clear to auscultation, no wheezes, rales or rhonchi, symmetric air entry Heart - normal rate and regular rhythm Abdomen - soft, nontender, nondistended, no masses or organomegaly Pelvic - examination not indicated Extremities - peripheral pulses normal, no pedal edema, no clubbing or cyanosis  Labs: Results for orders placed or performed during the hospital encounter of 09/17/14 (from the past 336 hour(s))  CBC   Collection Time: 09/17/14 10:45 AM  Result Value Ref Range   WBC 4.5 4.0 - 10.5 K/uL   RBC 3.92 3.87 - 5.11  MIL/uL   Hemoglobin 12.8 12.0 - 15.0 g/dL   HCT 36.5 36.0 - 46.0 %   MCV 93.1 78.0 - 100.0 fL   MCH 32.7 26.0 - 34.0 pg   MCHC 35.1 30.0 - 36.0 g/dL   RDW 13.0 11.5 - 15.5 %   Platelets 222 150 - 400 K/uL  Basic metabolic panel   Collection Time: 09/17/14 10:45 AM  Result Value Ref Range   Sodium 139 135 - 145 mmol/L   Potassium 3.0 (L) 3.5 - 5.1 mmol/L   Chloride 105 101 - 111 mmol/L   CO2 28 22 - 32 mmol/L   Glucose, Bld 93 65 - 99 mg/dL   BUN 11 6 - 20 mg/dL   Creatinine, Ser 1.00 0.44 - 1.00 mg/dL   Calcium 8.7 (L) 8.9 - 10.3 mg/dL   GFR calc non Af Amer >60 >60 mL/min   GFR calc Af Amer >60 >60 mL/min   Anion gap 6 5 - 15    Imaging Studies: 11/28/2012 TRANSABDOMINAL AND TRANSVAGINAL ULTRASOUND OF PELVIS Technique: Both transabdominal and transvaginal ultrasound examinations of the pelvis were performed. Transabdominal technique was performed for global imaging of the pelvis including uterus, ovaries, adnexal regions, and pelvic cul-de-sac.  It was necessary to proceed with endovaginal exam following the transabdominal exam to visualize the uterus, ovaries, and adnexa .  Comparison: 10/09/2008  Findings:  Uterus: 15.2 x 8.3 x 10.8 cm. Dominant uterine fibroid involves the fundus. Heterogeneous, 8.5 x 8.9 x 8.0 cm. 2 lesions, measuring up to 7.3 cm, were present in this areaon the prior.  Endometrium: Mildly thickened, at 1.8 cm. Suboptimally visualized secondary uterine fibroids.  Right ovary: 3.8 x 2.4 x 2.7 cm. Normal in morphology.  Left ovary: 3.3 x 2.6 x 2.9 cm. Normal in morphology.  Other findings: No free fluid  IMPRESSION:  1. Dominant uterine fundal fibroid. 2. Mild endometrial thickening. Endometrium partially obscured by uterine fibroids. Consider follow-up with pelvic ultrasound in six - 12 weeks, the week following the patient's menses.  Assessment: Symptomatic large uterine fibroids   Plan: Patient will undergo  surgical management with Total abdominal hysterectomy with bilateral salpingectomy.   The risks of surgery were discussed in detail with the patient including but not limited to: bleeding which may require transfusion or reoperation; infection which may require antibiotics; injury to surrounding organs which may involve bowel, bladder, ureters ; need for additional procedures including laparoscopy or laparotomy; thromboembolic phenomenon, surgical site problems and other postoperative/anesthesia complications. Likelihood of success in alleviating the patient's condition was discussed. Routine postoperative instructions will be reviewed with the patient and her family in detail after surgery.  The patient concurred with the proposed plan, giving informed written consent for the surgery.  Patient has been NPO since last night she will remain NPO for procedure.  Anesthesia and OR aware.  Preoperative prophylactic antibiotics and SCDs ordered on call to the OR.  To OR when ready.  Sindhu Nguyen L. Ihor Dow, M.D., [facility] 09/23/2014 9:37 AM

## 2014-09-24 ENCOUNTER — Encounter (HOSPITAL_COMMUNITY): Payer: Self-pay | Admitting: Obstetrics & Gynecology

## 2014-09-24 LAB — BASIC METABOLIC PANEL
Anion gap: 3 — ABNORMAL LOW (ref 5–15)
BUN: 8 mg/dL (ref 6–20)
CO2: 29 mmol/L (ref 22–32)
Calcium: 7.8 mg/dL — ABNORMAL LOW (ref 8.9–10.3)
Chloride: 109 mmol/L (ref 101–111)
Creatinine, Ser: 0.83 mg/dL (ref 0.44–1.00)
GFR calc Af Amer: >60 mL/min (ref >60)
GFR calc non Af Amer: >60 mL/min (ref >60)
Glucose, Bld: 119 mg/dL — ABNORMAL HIGH (ref 65–99)
Potassium: 3.9 mmol/L (ref 3.5–5.1)
Sodium: 141 mmol/L (ref 135–145)

## 2014-09-24 LAB — CBC
HCT: 28.6 % — ABNORMAL LOW (ref 36.0–46.0)
Hemoglobin: 9.9 g/dL — ABNORMAL LOW (ref 12.0–15.0)
MCH: 32.2 pg (ref 26.0–34.0)
MCHC: 34.6 g/dL (ref 30.0–36.0)
MCV: 93.2 fL (ref 78.0–100.0)
Platelets: 188 10*3/uL (ref 150–400)
RBC: 3.07 MIL/uL — ABNORMAL LOW (ref 3.87–5.11)
RDW: 13.1 % (ref 11.5–15.5)
WBC: 8.1 10*3/uL (ref 4.0–10.5)

## 2014-09-24 NOTE — Progress Notes (Signed)
Per Dr. Ihor Dow hold blood pressure medicines for today due to low BP.

## 2014-09-24 NOTE — Addendum Note (Signed)
Addendum  created 09/24/14 9437 by Ignacia Bayley, CRNA   Modules edited: Notes Section   Notes Section:  File: [id number]

## 2014-09-24 NOTE — Progress Notes (Signed)
1 Day Post-Op Procedure(s) (LRB): HYSTERECTOMY ABDOMINAL (N/A) BILATERAL SALPINGECTOMY (Bilateral)  Subjective: Patient reports tolerating PO.  Pt reports no N/V.  Tolerated regular BF.  She has walked in room but, still hs the foley in place.  She has not passed gas or BM.  She reports adequate pain control.  Objective: I have reviewed patient's vital signs, intake and output, medications and labs. BP 102/52 mmHg  Pulse 72  Temp(Src) 98 F (36.7 C) (Oral)  Resp 18  Ht 5\' 4"  (1.626 m)  Wt 205 lb (92.987 kg)  BMI 35.17 kg/m2  SpO2 100%  I/O last 3 completed shifts: In: 4670.7 [P.O.:120; I.V.:4550.7] Out: 3350 [Urine:2200; Blood:1150]     General: alert and no distress Resp: clear to auscultation bilaterally Cardio: regular rate and rhythm, S1, S2 normal, no murmur, click, rub or gallop GI: soft, non-tender; bowel sounds normal; no masses,  no organomegaly and incision: pressure dressing removed. Extremities: extremities normal, atraumatic, no cyanosis or edema Vaginal Bleeding: minimal   Foley draining clear urine  CBC    Component Value Date/Time   WBC 8.1 09/24/2014 0525   RBC 3.07* 09/24/2014 0525   HGB 9.9* 09/24/2014 0525   HCT 28.6* 09/24/2014 0525   PLT 188 09/24/2014 0525   MCV 93.2 09/24/2014 0525   MCH 32.2 09/24/2014 0525   MCHC 34.6 09/24/2014 0525   RDW 13.1 09/24/2014 0525   LYMPHSABS 2.4 11/28/2012 1240   MONOABS 0.3 11/28/2012 1240   EOSABS 0.1 11/28/2012 1240   BASOSABS 0.0 11/28/2012 1240     Assessment: s/p Procedure(s) with comments: HYSTERECTOMY ABDOMINAL (N/A) - Requested 09/23/14 @ 10:00a BILATERAL SALPINGECTOMY (Bilateral): stable  Plan: Advance diet Encourage ambulation remove foley   LOS: 1 day    HARRAWAY-SMITH, Zorawar Strollo 09/24/2014, 11:08 AM

## 2014-09-24 NOTE — Anesthesia Postprocedure Evaluation (Signed)
  Anesthesia Post-op Note  Patient: Carrie Bryan  Procedure(s) Performed: Procedure(s) with comments: HYSTERECTOMY ABDOMINAL (N/A) - Requested 09/23/14 @ 10:00a BILATERAL SALPINGECTOMY (Bilateral)  Patient Location: [facility]  Anesthesia Type:General  Level of Consciousness: awake  Airway and Oxygen Therapy: Patient Spontanous Breathing  Post-op Pain: mild  Post-op Assessment: Patient's Cardiovascular Status Stable and Respiratory Function Stable              Post-op Vital Signs: stable  Last Vitals:  Filed Vitals:   09/24/14 0914  BP: 102/52  Pulse: 72  Temp: 36.7 C  Resp: 18    Complications: No apparent anesthesia complications

## 2014-09-25 MED ORDER — DOCUSATE SODIUM 100 MG PO CAPS: 100.0000 mg | ORAL_CAPSULE | Freq: Two times a day (BID) | ORAL | Status: DC

## 2014-09-25 MED ORDER — OXYCODONE-ACETAMINOPHEN 5-325 MG PO TABS: 1.0000 | ORAL_TABLET | ORAL | Status: DC | PRN

## 2014-09-25 MED ORDER — POTASSIUM CHLORIDE CRYS ER 20 MEQ PO TBCR: 20.0000 meq | EXTENDED_RELEASE_TABLET | Freq: Every day | ORAL | Status: DC

## 2014-09-25 MED ORDER — IBUPROFEN 600 MG PO TABS: 600.0000 mg | ORAL_TABLET | Freq: Four times a day (QID) | ORAL | Status: DC | PRN

## 2014-09-25 NOTE — Progress Notes (Signed)
Pt is discharged n the care of son.with N.T. Escort. Denies any pain or discomfort.No equipment needed for home use. Abdominal incisions are clean and dry. Stable. Understands all discharge instructions well.  Questions asked and answered.

## 2014-09-25 NOTE — Discharge Summary (Signed)
Physician Discharge Summary  Patient ID: Carrie Bryan MRN: [id number] DOB/AGE: May 27, 1965 49 y.o.  Admit date: 09/23/2014 Discharge date: 09/25/2014  Admission Diagnoses: symptomatic uterine fibroids  Discharge Diagnoses:  Principal Problem:   Fibroids Active Problems:   Post-operative state   Discharged Condition: good  Hospital Course: Patient had an uncomplicated surgery; for further details of this surgery, please refer to the operative note. Furthermore, the patient had an uncomplicated postoperative course.  By time of discharge, her pain was controlled on oral pain medications; she was ambulating, voiding without difficulty, tolerating regular diet and passing flatus.  She was deemed stable for discharge to home.    Consults: None  Significant Diagnostic Studies: labs: CBC  Treatments: surgery: Total abdominal hysterectomy with bilateral salpingectomy  Discharge Exam: Blood pressure 115/63, pulse 77, temperature 98.2 F (36.8 C), temperature source Oral, resp. rate 18, height 5\' 4"  (1.626 m), weight 205 lb (92.987 kg), SpO2 100 %.   I/O last 3 completed shifts: In: 3884.9 [P.O.:1480; I.V.:2404.9] Out: 3050 [Urine:3050]   General appearance: alert and no distress Resp: clear to auscultation bilaterally Cardio: regular rate and rhythm, S1, S2 normal, no murmur, click, rub or gallop GI: soft, non-tender; bowel sounds normal; no masses,  no organomegaly Extremities: extremities normal, atraumatic, no cyanosis or edema Incision/Wound: dressing dry  CBC    Component Value Date/Time   WBC 8.1 09/24/2014 0525   RBC 3.07* 09/24/2014 0525   HGB 9.9* 09/24/2014 0525   HCT 28.6* 09/24/2014 0525   PLT 188 09/24/2014 0525   MCV 93.2 09/24/2014 0525   MCH 32.2 09/24/2014 0525   MCHC 34.6 09/24/2014 0525   RDW 13.1 09/24/2014 0525   LYMPHSABS 2.4 11/28/2012 1240   MONOABS 0.3 11/28/2012 1240   EOSABS 0.1 11/28/2012 1240   BASOSABS 0.0 11/28/2012 1240       Disposition: 01-Home or Self Care  Discharge Instructions    Call MD for:  difficulty breathing, headache or visual disturbances    Complete by:  As directed      Call MD for:  extreme fatigue    Complete by:  As directed      Call MD for:  hives    Complete by:  As directed      Call MD for:  persistant dizziness or light-headedness    Complete by:  As directed      Call MD for:  persistant nausea and vomiting    Complete by:  As directed      Call MD for:  redness, tenderness, or signs of infection (pain, swelling, redness, odor or green/yellow discharge around incision site)    Complete by:  As directed      Call MD for:  severe uncontrolled pain    Complete by:  As directed      Call MD for:  temperature >100.4    Complete by:  As directed      Diet - low sodium heart healthy    Complete by:  As directed      Discharge wound care:    Complete by:  As directed   Remove OUTER dressing in 1 week     Driving Restrictions    Complete by:  As directed   No driving for 2 weeks or while on pain medications by mouth     Increase activity slowly    Complete by:  As directed      Lifting restrictions    Complete by:  As directed  No heavy lifting.     Sexual Activity Restrictions    Complete by:  As directed   No sexual activity for 6 weeks            Medication List    TAKE these medications        docusate sodium 100 MG capsule  Commonly known as:  COLACE  Take 1 capsule (100 mg total) by mouth 2 (two) times daily.     hydrochlorothiazide 25 MG tablet  Commonly known as:  HYDRODIURIL  Take 25 mg by mouth daily.     ibuprofen 600 MG tablet  Commonly known as:  ADVIL,MOTRIN  Take 1 tablet (600 mg total) by mouth every 6 (six) hours as needed (mild pain).     oxyCODONE-acetaminophen 5-325 MG per tablet  Commonly known as:  PERCOCET/ROXICET  Take 1-2 tablets by mouth every 4 (four) hours as needed (moderate to severe pain (when tolerating fluids)).      potassium chloride SA 20 MEQ tablet  Commonly known as:  K-DUR,KLOR-CON  Take 1 tablet (20 mEq total) by mouth daily. 2 tablets daily for 4 days     sertraline 25 MG tablet  Commonly known as:  ZOLOFT  Take 25 mg by mouth daily.           Follow-up Information    Follow up with Lavonia Drafts, MD In 2 weeks.   Specialty:  Obstetrics and Gynecology   Contact information:   [place] [address] [phone]       Signed: Lavonia Drafts 09/25/2014, 3:11 PM

## 2014-09-26 ENCOUNTER — Ambulatory Visit (HOSPITAL_COMMUNITY): Payer: Medicaid Other | Attending: Obstetrics & Gynecology

## 2014-09-30 ENCOUNTER — Telehealth: Payer: Self-pay | Admitting: *Deleted

## 2014-09-30 NOTE — Telephone Encounter (Addendum)
Pt left message stating that she had partial hysterectomy on 6/7.  She wants to know if the strips will fall off or if she needs to take them off. I called pt back and heard a message from a female. I left message for Nataleah to call us back. I also called her home # [phone] and heard message that the voice mail box is full.   5/15  1155  Called pt and was not able to leave message for pt due to mailbox being full.

## 2014-10-03 NOTE — Telephone Encounter (Signed)
Called patient, no answer- left message stating we are trying to reach you to return your phone call, if you still need assistance please call us back at the clinics

## 2014-10-09 ENCOUNTER — Inpatient Hospital Stay (HOSPITAL_COMMUNITY)
Admission: AD | Admit: 2014-10-09 | Discharge: 2014-10-09 | Disposition: A | Discharge status: Home / Self Care | Payer: Medicaid Other | Source: Ambulatory Visit | Attending: Obstetrics and Gynecology | Admitting: Obstetrics and Gynecology

## 2014-10-09 ENCOUNTER — Encounter (HOSPITAL_COMMUNITY): Payer: Self-pay | Admitting: *Deleted

## 2014-10-09 DIAGNOSIS — Z90710 Acquired absence of both cervix and uterus: Secondary | ICD-10-CM | POA: Insufficient documentation

## 2014-10-09 DIAGNOSIS — L03311 Cellulitis of abdominal wall: Secondary | ICD-10-CM | POA: Insufficient documentation

## 2014-10-09 MED ORDER — SULFAMETHOXAZOLE-TRIMETHOPRIM 800-160 MG PO TABS: 1.0000 | ORAL_TABLET | Freq: Two times a day (BID) | ORAL | Status: DC

## 2014-10-09 MED ORDER — SULFAMETHOXAZOLE-TRIMETHOPRIM 800-160 MG PO TABS
1.0000 | ORAL_TABLET | Freq: Once | ORAL | Status: DC
Start: 2014-10-09 — End: 2014-10-09

## 2014-10-09 NOTE — Discharge Instructions (Signed)
Cellulitis Cellulitis is an infection of the skin and the tissue under the skin. The infected area is usually red and tender. This happens most often in the arms and lower legs. HOME CARE   Take your antibiotic medicine as told. Finish the medicine even if you start to feel better.  Keep the infected arm or leg raised (elevated).  Put a warm cloth on the area up to 4 times per day.  Only take medicines as told by your doctor.  Keep all doctor visits as told. GET HELP IF:  You see red streaks on the skin coming from the infected area.  Your red area gets bigger or turns a dark color.  Your bone or joint under the infected area is painful after the skin heals.  Your infection comes back in the same area or different area.  You have a puffy (swollen) bump in the infected area.  You have new symptoms.  You have a fever. GET HELP RIGHT AWAY IF:   You feel very sleepy.  You throw up (vomit) or have watery poop (diarrhea).  You feel sick and have muscle aches and pains. MAKE SURE YOU:   Understand these instructions.  Will watch your condition.  Will get help right away if you are not doing well or get worse. Document Released: 09/21/2007 Document Revised: 08/19/2013 Document Reviewed: 06/20/2011 [facility] Patient Information 2015 [place], [place]. This information is not intended to replace advice given to you by your health care provider. Make sure you discuss any questions you have with your health care provider.  Abdominal Hysterectomy, Care After These instructions give you information on caring for yourself after your procedure. Your doctor may also give you more specific instructions. Call your doctor if you have any problems or questions after your procedure.  HOME CARE It takes 4-6 weeks to recover from this surgery. Follow all of your doctor's instructions.   Only take medicines as told by your doctor.  Change your bandage as told by your doctor.  Return to your  doctor to have your stitches taken out.  Take showers for 2-3 weeks. Ask your doctor when it is okay to shower.  Do not douche, use tampons, or have sex (intercourse) for at least 6 weeks or as told.  Follow your doctor's advice about exercise, lifting objects, driving, and general activities.  Get plenty of rest and sleep.  Do not lift anything heavier than a gallon of milk (about 10 pounds [4.5 kilograms]) for the first month after surgery.  Get back to your normal diet as told by your doctor.  Do not drink alcohol until your doctor says it is okay.  Take a medicine to help you poop (laxative) as told by your doctor.  Eating foods high in fiber may help you poop. Eat a lot of raw fruits and vegetables, whole grains, and beans.  Drink enough fluids to keep your pee (urine) clear or pale yellow.  Have someone help you at home for 1-2 weeks after your surgery.  Keep follow-up doctor visits as told. GET HELP IF:  You have chills or fever.  You have puffiness, redness, or pain in area of the cut (incision).  You have yellowish-white fluid (pus) coming from the cut.  You have a bad smell coming from the cut or bandage.  Your cut pulls apart.  You feel dizzy or light-headed.  You have pain or bleeding when you pee.  You keep having watery poop (diarrhea).  You keep feeling  sick to your stomach (nauseous) or keep throwing up (vomiting).  You have fluid (discharge) coming from your vagina.  You have a rash.  You have a reaction to your medicine.  You need stronger pain medicine. GET HELP RIGHT AWAY IF:   You have a fever and your symptoms suddenly get worse.  You have bad belly (abdominal) pain.  You have chest pain.  You are short of breath.  You pass out (faint).  You have pain, puffiness, or redness of your leg.  You bleed a lot from your vagina and notice clumps of tissue (clots). MAKE SURE YOU:   Understand these instructions.  Will watch your  condition.  Will get help right away if you are not doing well or get worse. Document Released: 01/12/2008 Document Revised: 04/09/2013 Document Reviewed: 01/25/2013 [facility] Patient Information 2015 [place], [place]. This information is not intended to replace advice given to you by your health care provider. Make sure you discuss any questions you have with your health care provider.

## 2014-10-09 NOTE — MAU Note (Signed)
Pt had abdominal hysterectomy 6/7. Noticed redness & discharge from abdominal site yesterday. Patient is unsure if it is painful or tender. Discharge is green/yellow with foul odor. Denies fever.

## 2014-10-09 NOTE — MAU Provider Note (Signed)
History     CSN: [id number]  Arrival date and time: 10/09/14 9470   First Provider Initiated Contact with Patient 10/09/14 0056      Chief Complaint  Patient presents with  . Post-op Problem   HPI  Carrie Bryan is a 49 y.o. [id number] who presents to MAU today with complaint of pain at incision site. The patient states that she had abdominal hysterectomy and bilateral salpingectomy on 09/23/14. She states that she has continued to have mild pain around her incision site. This pain is unchanged from the time of discharge post operatively. She has been taking Ibuprofen and Percocet PRN for pain. She rates pain at 3/10 now. She endorses a drainage or discharge and redness around the incision site. She has continued to have light vaginal spotting. She denies abnormal discharge or fever.  OB History    Gravida Para Term Preterm AB TAB SAB Ectopic Multiple Living   10 3 3  7 6 1   3       Past Medical History  Diagnosis Date  . Bacterial vaginosis   . Hypertension   . Depression   . Anxiety     Past Surgical History  Procedure Laterality Date  . Cholecystectomy    . Dilation and curettage of uterus    . Abdominal hysterectomy N/A 09/23/2014    Procedure: HYSTERECTOMY ABDOMINAL;  Surgeon: Lavonia Drafts, MD;  Location: [facility];  Service: Gynecology;  Laterality: N/A;  Requested 09/23/14 @ 10:00a  . Bilateral salpingectomy Bilateral 09/23/2014    Procedure: BILATERAL SALPINGECTOMY;  Surgeon: Lavonia Drafts, MD;  Location: [facility];  Service: Gynecology;  Laterality: Bilateral;    History reviewed. No pertinent family history.  History  Substance Use Topics  . Smoking status: Never Smoker   . Smokeless tobacco: Never Used  . Alcohol Use: No    Allergies: No Known Allergies  Prescriptions prior to admission  Medication Sig Dispense Refill Last Dose  . hydrochlorothiazide (HYDRODIURIL) 25 MG tablet Take 25 mg by mouth daily.     10/08/2014 at Unknown time  .  ibuprofen (ADVIL,MOTRIN) 600 MG tablet Take 1 tablet (600 mg total) by mouth every 6 (six) hours as needed (mild pain). 30 tablet 0 10/09/2014 at Unknown time  . oxyCODONE-acetaminophen (PERCOCET/ROXICET) 5-325 MG per tablet Take 1-2 tablets by mouth every 4 (four) hours as needed (moderate to severe pain (when tolerating fluids)). 30 tablet 0 Past Week at Unknown time  . sertraline (ZOLOFT) 25 MG tablet Take 25 mg by mouth daily.   10/08/2014 at Unknown time  . docusate sodium (COLACE) 100 MG capsule Take 1 capsule (100 mg total) by mouth 2 (two) times daily. 10 capsule 0   . potassium chloride SA (K-DUR,KLOR-CON) 20 MEQ tablet Take 1 tablet (20 mEq total) by mouth daily. 2 tablets daily for 4 days 30 tablet 1     Review of Systems  Constitutional: Negative for fever and malaise/fatigue.  Gastrointestinal: Positive for abdominal pain and constipation. Negative for nausea, vomiting and diarrhea.  Genitourinary: Negative for dysuria, urgency and frequency.       + spotting Neg - vaginal discharge   Physical Exam   Blood pressure 129/78, pulse 76, temperature 98.2 F (36.8 C), temperature source Oral, resp. rate 18, height 5' 4.5" (1.638 m), weight 204 lb (92.534 kg), last menstrual period 09/10/2014, SpO2 100 %.  Physical Exam  Nursing note and vitals reviewed. Constitutional: She is oriented to person, place, and time. She appears well-developed and  well-nourished. No distress.  HENT:  Head: Normocephalic and atraumatic.  Cardiovascular: Normal rate.   Respiratory: Effort normal.  GI: Soft. She exhibits no distension and no mass. There is no tenderness. There is no rebound and no guarding.    Neurological: She is alert and oriented to person, place, and time.  Skin: Skin is warm and dry. No erythema.  Psychiatric: She has a normal mood and affect.    MAU Course  Procedures None  MDM Afebrile and non-tender. Will treat with outpatient antibiotics  Assessment and Plan   A: Cellulitis  P: Discharge home Rx for Bactrim given to patient Warning signs for worsening condition discussed Patient advised to follow-up with [facility] as scheduled for post op appointment or sooner if symptoms worsen Patient may return to MAU as needed or if her condition were to change or worsen   Luvenia Redden, PA-C  10/09/2014, 1:07 AM

## 2014-10-10 ENCOUNTER — Ambulatory Visit (INDEPENDENT_AMBULATORY_CARE_PROVIDER_SITE_OTHER): Payer: Medicaid Other | Admitting: Obstetrics & Gynecology

## 2014-10-10 ENCOUNTER — Encounter: Payer: Self-pay | Admitting: Obstetrics & Gynecology

## 2014-10-10 VITALS — BP 121/67 | HR 86 | Temp 97.7°F | Ht 64.0 in | Wt 203.5 lb

## 2014-10-10 DIAGNOSIS — Z9889 Other specified postprocedural states: Secondary | ICD-10-CM

## 2014-10-10 NOTE — Patient Instructions (Signed)
Abdominal Hysterectomy, Care After Refer to this sheet in the next few weeks. These instructions provide you with information on caring for yourself after your procedure. Your health care provider may also give you more specific instructions. Your treatment has been planned according to current medical practices, but problems sometimes occur. Call your health care provider if you have any problems or questions after your procedure.  WHAT TO EXPECT AFTER THE PROCEDURE After your procedure, it is typical to have the following:  Pain.  Feeling tired.  Poor appetite.  Less interest in sex. HOME CARE INSTRUCTIONS  It takes 4-6 weeks to recover from this surgery. Make sure you follow all your health care provider's instructions. Home care instructions may include:  Take pain medicines only as directed by your health care provider. Do not take over-the-counter pain medicines without checking with your health care provider first.  Change your bandage as directed by your health care provider.  Return to your health care provider to have your sutures taken out.  Take showers instead of baths for 2-3 weeks. Ask your health care provider when it is safe to start showering.  Do not douche, use tampons, or have sexual intercourse for at least 6 weeks or until your health care provider says you can.   Follow your health care provider's advice about exercise, lifting, driving, and general activities.  Get plenty of rest and sleep.   Do not lift anything heavier than a gallon of milk (about 10 lb [4.5 kg]) for the first month after surgery.  You can resume your normal diet if your health care provider says it is okay.   Do not drink alcohol until your health care provider says you can.   If you are constipated, ask your health care provider if you can take a mild laxative.  Eating foods high in fiber may also help with constipation. Eat plenty of raw fruits and vegetables, whole grains, and  beans.  Drink enough fluids to keep your urine clear or pale yellow.   Try to have someone at home with you for the first 1-2 weeks to help around the house.  Keep all follow-up appointments. SEEK MEDICAL CARE IF:   You have chills or fever.  You have swelling, redness, or pain in the area of your incision that is getting worse.   You have pus coming from the incision.   You notice a bad smell coming from the incision or bandage.   Your incision breaks open.   You feel dizzy or light-headed.   You have pain or bleeding when you urinate.   You have persistent diarrhea.   You have persistent nausea and vomiting.   You have abnormal vaginal discharge.   You have a rash.   You have any type of abnormal reaction or develop an allergy to your medicine.   Your pain medicine is not helping.  SEEK IMMEDIATE MEDICAL CARE IF:   You have a fever and your symptoms suddenly get worse.  You have severe abdominal pain.  You have chest pain.  You have shortness of breath.  You faint.  You have pain, swelling, or redness of your leg.  You have heavy vaginal bleeding with blood clots. MAKE SURE YOU:  Understand these instructions.  Will watch your condition.  Will get help right away if you are not doing well or get worse. Document Released: 10/22/2004 Document Revised: 04/09/2013 Document Reviewed: 01/25/2013 Exit[employer] Patient Information 2015 [employer]. This information is not intended   to replace advice given to you by your health care provider. Make sure you discuss any questions you have with your health care provider.  

## 2014-10-10 NOTE — Progress Notes (Signed)
Subjective:     Patient ID: Carrie Bryan, female   DOB: [birth year], 49 y.o.   MRN: [id number]  HPI Pt presents for her 2 weeks post op check.  She was seen in the MAU for a presumed wound infection.  She reports that she was told not to clean the wound and was not.  She denies fever or chills.  She is off all pain meds and is voiding and passing stools without difficulty.     Review of Systems     Objective:   Physical Exam BP 121/67 mmHg  Pulse 86  Temp(Src) 97.7 F (36.5 C) (Oral)  Ht 5\' 4"  (1.626 m)  Wt 203 lb 8 oz (92.307 kg)  BMI 34.91 kg/m2  LMP 09/10/2014 (Approximate) Pt in NAD Abd: wound is covered in a yellow film.  NO active signs of infection or breakdown.  The wound was cleaned with antibacterial soap and pt was instructed on how to keep the wound clean and dry.     09/23/2014 Diagnosis Uterus and bilateral fallopian tubes, with cervix - SECRETORY PATTERN ENDOMETRIUM WITH UNDERLYING MYOMETRIUM DEMONSTRATING LEIOMYOMA FORMATION. - BENIGN CERVIX. - BENIGN BILATERAL FALLOPIAN TUBES. - NO DYSPLASIA, ATYPIA, HYPERPLASIA, OR MALIGNANCY IDENTIFIED.    Assessment:     2 week post op check Superficial wound infection     Plan:     F/u in 4 weeks or sooner prn Keep wound clean and dry- f/u if erythema or drainage from wound or wound breakdown occurs. Gradual increase of activities  Kimberlynn Lumbra L. Harraway-Smith, M.D., Cherlynn June

## 2014-10-29 ENCOUNTER — Encounter: Payer: Self-pay | Admitting: *Deleted

## 2014-11-05 ENCOUNTER — Ambulatory Visit: Payer: Medicaid Other | Admitting: Obstetrics & Gynecology

## 2014-11-10 ENCOUNTER — Ambulatory Visit (INDEPENDENT_AMBULATORY_CARE_PROVIDER_SITE_OTHER): Payer: Medicaid Other | Admitting: Obstetrics & Gynecology

## 2014-11-10 ENCOUNTER — Encounter: Payer: Self-pay | Admitting: Obstetrics & Gynecology

## 2014-11-10 VITALS — BP 134/83 | HR 57 | Temp 97.5°F | Ht 62.0 in | Wt 203.0 lb

## 2014-11-10 DIAGNOSIS — Z9889 Other specified postprocedural states: Secondary | ICD-10-CM

## 2014-11-10 NOTE — Progress Notes (Signed)
Patient ID: Carrie Bryan, female   DOB: [birth year], 49 y.o.   MRN: [id number] History:  49 y.o. [id number] here today for 6 week post op check.  She denies problems.  She reports voiding and passing stools without difficulty.  She has not been sexually active since surgery.    The following portions of the patient's history were reviewed and updated as appropriate: allergies, current medications, past family history, past medical history, past social history, past surgical history and problem list.  Review of Systems:  Pertinent items are noted in HPI.  Objective:  Physical Exam Blood pressure 134/83, pulse 57, temperature 97.5 F (36.4 C), temperature source Oral, height 5\' 2"  (1.575 m), weight 203 lb (92.08 kg), last menstrual period 09/10/2014. Gen: NAD Abd: Soft, nontender and nondistended Pelvic: Normal appearing external genitalia; normal appearing vaginal mucosa.  There is an area of granulation tissue at the cuf- silver nitrate applied to that area.  Normal discharge.  No palpable masses, no adnexal tenderness   Assessment & Plan:  6 weeks post op check doing well Routine post op instructions reviewed. Pt to f/u in 3 months or sooner prn Gradual return to full activities  [place]. Harraway-Smith, M.D., Cherlynn June

## 2014-11-10 NOTE — Patient Instructions (Signed)
Abdominal Hysterectomy, Care After Refer to this sheet in the next few weeks. These instructions provide you with information on caring for yourself after your procedure. Your health care provider may also give you more specific instructions. Your treatment has been planned according to current medical practices, but problems sometimes occur. Call your health care provider if you have any problems or questions after your procedure.  WHAT TO EXPECT AFTER THE PROCEDURE After your procedure, it is typical to have the following:  Pain.  Feeling tired.  Poor appetite.  Less interest in sex. HOME CARE INSTRUCTIONS  It takes 4-6 weeks to recover from this surgery. Make sure you follow all your health care provider's instructions. Home care instructions may include:  Take pain medicines only as directed by your health care provider. Do not take over-the-counter pain medicines without checking with your health care provider first.  Change your bandage as directed by your health care provider.  Return to your health care provider to have your sutures taken out.  Take showers instead of baths for 2-3 weeks. Ask your health care provider when it is safe to start showering.  Do not douche, use tampons, or have sexual intercourse for at least 6 weeks or until your health care provider says you can.   Follow your health care provider's advice about exercise, lifting, driving, and general activities.  Get plenty of rest and sleep.   Do not lift anything heavier than a gallon of milk (about 10 lb [4.5 kg]) for the first month after surgery.  You can resume your normal diet if your health care provider says it is okay.   Do not drink alcohol until your health care provider says you can.   If you are constipated, ask your health care provider if you can take a mild laxative.  Eating foods high in fiber may also help with constipation. Eat plenty of raw fruits and vegetables, whole grains, and  beans.  Drink enough fluids to keep your urine clear or pale yellow.   Try to have someone at home with you for the first 1-2 weeks to help around the house.  Keep all follow-up appointments. SEEK MEDICAL CARE IF:   You have chills or fever.  You have swelling, redness, or pain in the area of your incision that is getting worse.   You have pus coming from the incision.   You notice a bad smell coming from the incision or bandage.   Your incision breaks open.   You feel dizzy or light-headed.   You have pain or bleeding when you urinate.   You have persistent diarrhea.   You have persistent nausea and vomiting.   You have abnormal vaginal discharge.   You have a rash.   You have any type of abnormal reaction or develop an allergy to your medicine.   Your pain medicine is not helping.  SEEK IMMEDIATE MEDICAL CARE IF:   You have a fever and your symptoms suddenly get worse.  You have severe abdominal pain.  You have chest pain.  You have shortness of breath.  You faint.  You have pain, swelling, or redness of your leg.  You have heavy vaginal bleeding with blood clots. MAKE SURE YOU:  Understand these instructions.  Will watch your condition.  Will get help right away if you are not doing well or get worse. Document Released: 10/22/2004 Document Revised: 04/09/2013 Document Reviewed: 01/25/2013 Exit[employer] Patient Information 2015 [employer]. This information is not intended   to replace advice given to you by your health care provider. Make sure you discuss any questions you have with your health care provider.  

## 2014-11-11 ENCOUNTER — Encounter: Payer: Self-pay | Admitting: Obstetrics & Gynecology

## 2015-02-21 ENCOUNTER — Other Ambulatory Visit: Payer: Self-pay | Admitting: Internal Medicine

## 2015-02-21 DIAGNOSIS — E2839 Other primary ovarian failure: Secondary | ICD-10-CM

## 2015-05-14 ENCOUNTER — Inpatient Hospital Stay: Admission: RE | Admit: 2015-05-14 | Payer: Medicaid Other | Source: Ambulatory Visit

## 2015-07-17 ENCOUNTER — Other Ambulatory Visit: Payer: Self-pay | Admitting: Internal Medicine

## 2015-07-17 DIAGNOSIS — Z1231 Encounter for screening mammogram for malignant neoplasm of breast: Secondary | ICD-10-CM

## 2015-07-28 ENCOUNTER — Ambulatory Visit: Payer: Medicaid Other

## 2015-07-28 ENCOUNTER — Inpatient Hospital Stay: Admission: RE | Admit: 2015-07-28 | Payer: Medicaid Other | Source: Ambulatory Visit

## 2015-10-01 ENCOUNTER — Ambulatory Visit: Payer: Medicaid Other | Admitting: Obstetrics & Gynecology

## 2015-12-20 ENCOUNTER — Emergency Department (HOSPITAL_COMMUNITY)
Admission: EM | Admit: 2015-12-20 | Discharge: 2015-12-20 | Disposition: A | Discharge status: Home / Self Care | Payer: Medicaid Other | Attending: Emergency Medicine | Admitting: Emergency Medicine

## 2015-12-20 ENCOUNTER — Encounter (HOSPITAL_COMMUNITY): Payer: Self-pay

## 2015-12-20 DIAGNOSIS — R609 Edema, unspecified: Secondary | ICD-10-CM

## 2015-12-20 DIAGNOSIS — Z79899 Other long term (current) drug therapy: Secondary | ICD-10-CM | POA: Insufficient documentation

## 2015-12-20 DIAGNOSIS — R600 Localized edema: Secondary | ICD-10-CM | POA: Insufficient documentation

## 2015-12-20 DIAGNOSIS — I10 Essential (primary) hypertension: Secondary | ICD-10-CM | POA: Insufficient documentation

## 2015-12-20 DIAGNOSIS — M7989 Other specified soft tissue disorders: Secondary | ICD-10-CM | POA: Diagnosis present

## 2015-12-20 LAB — I-STAT CHEM 8, ED
BUN: 13 mg/dL (ref 6–20)
CALCIUM ION: 1.18 mmol/L (ref 1.15–1.40)
CREATININE: 1.2 mg/dL — AB (ref 0.44–1.00)
Chloride: 103 mmol/L (ref 101–111)
GLUCOSE: 58 mg/dL — AB (ref 65–99)
HCT: 34 % — ABNORMAL LOW (ref 36.0–46.0)
Hemoglobin: 11.6 g/dL — ABNORMAL LOW (ref 12.0–15.0)
POTASSIUM: 3.3 mmol/L — AB (ref 3.5–5.1)
Sodium: 143 mmol/L (ref 135–145)
TCO2: 27 mmol/L (ref 0–100)

## 2015-12-20 NOTE — Discharge Instructions (Signed)
Please monitor your diet and eat food low in sodium.  Follow up with your doctor for further care.  Follow up with a nephrologist as needed.  Return if your condition worsen or if you have other concerns.

## 2015-12-20 NOTE — ED Provider Notes (Signed)
[facility] DEPT Provider Note   CSN: [id number] Arrival date & time: 12/20/15  1100     History   Chief Complaint Chief Complaint  Patient presents with  . Leg Swelling    HPI Carrie Bryan is a 50 y.o. female.  HPI   50 year old female with history of hypertension, depression, anxiety presenting today with complaints of bilateral leg swelling. Patient states for the past 2 days she feels that her legs are more swollen than usual, tight and uncomfortable. She denies any associated neck pain. No complaints of lightheadedness, dizziness, trouble breathing, fever, chills, or rash. She does admits to eating more unhealthy for the past several days including pizza, [place] food, and potato chips. She denies any history of congestive heart failure. No prior history of PE or DVT, no recent surgery, prolonged bed rest, active cancer, taking hormone pill, having calf pain. No specific treatment tried.   Past Medical History:  Diagnosis Date  . Anxiety   . Bacterial vaginosis   . Depression   . Hypertension     Patient Active Problem List   Diagnosis Date Noted  . Fibroids 09/23/2014  . Post-operative state 09/23/2014    Past Surgical History:  Procedure Laterality Date  . ABDOMINAL HYSTERECTOMY N/A 09/23/2014   Procedure: HYSTERECTOMY ABDOMINAL;  Surgeon: Lavonia Drafts, MD;  Location: [facility];  Service: Gynecology;  Laterality: N/A;  Requested 09/23/14 @ 10:00a  . BILATERAL SALPINGECTOMY Bilateral 09/23/2014   Procedure: BILATERAL SALPINGECTOMY;  Surgeon: Lavonia Drafts, MD;  Location: [facility];  Service: Gynecology;  Laterality: Bilateral;  . CHOLECYSTECTOMY    . DILATION AND CURETTAGE OF UTERUS      OB History    Gravida Para Term Preterm AB Living   10 3 3   7 3    SAB TAB Ectopic Multiple Live Births   1 6             Home Medications    Prior to Admission medications   Medication Sig Start Date End Date Taking? Authorizing Provider  docusate sodium  (COLACE) 100 MG capsule Take 1 capsule (100 mg total) by mouth 2 (two) times daily. Patient not taking: Reported on 11/10/2014 09/25/14   Lavonia Drafts, MD  hydrochlorothiazide (HYDRODIURIL) 25 MG tablet Take 25 mg by mouth daily.      Historical Provider, MD  ibuprofen (ADVIL,MOTRIN) 600 MG tablet Take 1 tablet (600 mg total) by mouth every 6 (six) hours as needed (mild pain). 09/25/14   Lavonia Drafts, MD  oxyCODONE-acetaminophen (PERCOCET/ROXICET) 5-325 MG per tablet Take 1-2 tablets by mouth every 4 (four) hours as needed (moderate to severe pain (when tolerating fluids)). Patient not taking: Reported on 11/10/2014 09/25/14   Lavonia Drafts, MD  potassium chloride SA (K-DUR,KLOR-CON) 20 MEQ tablet Take 20 mEq by mouth daily.    Historical Provider, MD  sertraline (ZOLOFT) 25 MG tablet Take 25 mg by mouth daily.    Historical Provider, MD    Family History No family history on file.  Social History Social History  Substance Use Topics  . Smoking status: Never Smoker  . Smokeless tobacco: Never Used  . Alcohol use No     Allergies   Review of patient's allergies indicates no known allergies.   Review of Systems Review of Systems  All other systems reviewed and are negative.    Physical Exam Updated Vital Signs BP 119/58   Pulse 66   Temp 97.8 F (36.6 C) (Oral)   Resp 18   LMP  09/10/2014 (Approximate)   SpO2 100%   Physical Exam  Constitutional: She appears well-developed and well-nourished. No distress.  Healthy-appearing obese female laying in bed in no acute discomfort.  HENT:  Head: Atraumatic.  Eyes: Conjunctivae are normal.  Neck: Neck supple. No JVD present.  Cardiovascular: Normal rate, regular rhythm and intact distal pulses.   Pulmonary/Chest: Effort normal and breath sounds normal. She has no rales.  Abdominal: Soft. There is no tenderness.  Musculoskeletal: She exhibits no edema.  Bilateral lower extremities without palpable cords,  erythema, or edema. Intact distal pedal pulses. Leg compartment is soft. No overlying skin changes.  Neurological: She is alert.  Skin: No rash noted.  Psychiatric: She has a normal mood and affect.  Nursing note and vitals reviewed.    ED Treatments / Results  Labs (all labs ordered are listed, but only abnormal results are displayed) Labs Reviewed  I-STAT CHEM 8, ED - Abnormal; Notable for the following:       Result Value   Potassium 3.3 (*)    Creatinine, Ser 1.20 (*)    Glucose, Bld 58 (*)    Hemoglobin 11.6 (*)    HCT 34.0 (*)    All other components within normal limits    EKG  EKG Interpretation None       Radiology No results found.  Procedures Procedures (including critical care time)  Medications Ordered in ED Medications - No data to display   Initial Impression / Assessment and Plan / ED Course  I have reviewed the triage vital signs and the nursing notes.  Pertinent labs & imaging results that were available during my care of the patient were reviewed by me and considered in my medical decision making (see chart for details).  Clinical Course   BP 115/77   Pulse 61   Temp 97.8 F (36.6 C) (Oral)   Resp 16   LMP 09/10/2014 (Approximate)   SpO2 97%    Final Clinical Impressions(s) / ED Diagnoses   Final diagnoses:  Peripheral edema    New Prescriptions New Prescriptions   No medications on file   11:49 AM Patient here expressing concern of bilateral leg swelling. No appreciable pedal edema noted on exam. No signs of skin infection or signs of injury. Her lungs are clear and she is in no respiratory discomfort. She does not have any significant risk factor for DVT. She does admits to eating unhealthy recently with food that has high sodium content. Will check basic labs.  Labs remarkable for mildly elevated creatinine of 1.2. Glucose is 58. Patient does not appear fluid overloaded. Her lungs are clear to auscultation. I encouraged patient  to monitor her sodium intake and follow-up with her doctor for further care. Nephrology referral given as needed. Return precaution discussed. Care discussed with Dr. Starr Sinclair, PA-C 12/20/15 [place], MD 12/20/15 [phone]

## 2015-12-20 NOTE — ED Triage Notes (Signed)
Patient here requesting evaluation for bilateral leg swelling and BP. States that she thinks she needs a fluid pill, no shortness of breath, minimal leg swelling, BP normal

## 2016-05-21 ENCOUNTER — Emergency Department (HOSPITAL_COMMUNITY): Payer: Medicaid Other

## 2016-05-21 ENCOUNTER — Emergency Department (HOSPITAL_COMMUNITY)
Admission: EM | Admit: 2016-05-21 | Discharge: 2016-05-21 | Disposition: A | Discharge status: Home / Self Care | Payer: Medicaid Other | Attending: Emergency Medicine | Admitting: Emergency Medicine

## 2016-05-21 ENCOUNTER — Encounter (HOSPITAL_COMMUNITY): Payer: Self-pay | Admitting: Emergency Medicine

## 2016-05-21 DIAGNOSIS — R059 Cough, unspecified: Secondary | ICD-10-CM

## 2016-05-21 DIAGNOSIS — R05 Cough: Secondary | ICD-10-CM | POA: Insufficient documentation

## 2016-05-21 DIAGNOSIS — Z79899 Other long term (current) drug therapy: Secondary | ICD-10-CM | POA: Diagnosis not present

## 2016-05-21 DIAGNOSIS — I10 Essential (primary) hypertension: Secondary | ICD-10-CM | POA: Diagnosis not present

## 2016-05-21 MED ORDER — BENZONATATE 100 MG PO CAPS: 100.0000 mg | ORAL_CAPSULE | Freq: Three times a day (TID) | ORAL | 0 refills | Status: DC | PRN

## 2016-05-21 MED ORDER — ALBUTEROL SULFATE HFA 108 (90 BASE) MCG/ACT IN AERS
2.0000 | INHALATION_SPRAY | RESPIRATORY_TRACT | Status: DC
  Administered 2016-05-21: 2 via RESPIRATORY_TRACT
  Filled 2016-05-21: qty 6.7
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
## 2016-05-21 NOTE — Discharge Instructions (Signed)
Please follow up with your primary care provider in 2-5 days as needed. Make sure to wear a mask when cleaning. Please use albuterol inhaler 2 puffs every 4-6 hours as needed for cough. Use Tessalon Perles as needed for cough. Take Benadryl at home as needed for possible dust allergy.  Contact a health care provider if: You have new symptoms. You cough up pus. Your cough does not get better after 2-3 weeks, or your cough gets worse. You cannot control your cough with suppressant medicines and you are losing sleep. You develop pain that is getting worse or pain that is not controlled with pain medicines. You have a fever. You have unexplained weight loss. You have night sweats. Get help right away if: You cough up blood. You have difficulty breathing. Your heartbeat is very fast.

## 2016-05-21 NOTE — ED Notes (Signed)
Signature pad not working for d/c.
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
## 2016-08-18 ENCOUNTER — Other Ambulatory Visit: Payer: Self-pay | Admitting: Internal Medicine

## 2016-08-18 DIAGNOSIS — Z1231 Encounter for screening mammogram for malignant neoplasm of breast: Secondary | ICD-10-CM

## 2016-09-09 ENCOUNTER — Ambulatory Visit
Admission: RE | Admit: 2016-09-09 | Discharge: 2016-09-09 | Disposition: A | Discharge status: Home / Self Care | Payer: Medicaid Other | Source: Ambulatory Visit | Attending: Internal Medicine | Admitting: Internal Medicine

## 2016-09-09 DIAGNOSIS — Z1231 Encounter for screening mammogram for malignant neoplasm of breast: Secondary | ICD-10-CM

## 2017-03-31 ENCOUNTER — Encounter (HOSPITAL_COMMUNITY): Payer: Self-pay | Admitting: Emergency Medicine

## 2017-03-31 ENCOUNTER — Other Ambulatory Visit: Payer: Self-pay

## 2017-03-31 ENCOUNTER — Ambulatory Visit (HOSPITAL_COMMUNITY)
Admission: EM | Admit: 2017-03-31 | Discharge: 2017-03-31 | Disposition: A | Discharge status: Home / Self Care | Payer: Medicaid Other | Attending: Internal Medicine | Admitting: Internal Medicine

## 2017-03-31 DIAGNOSIS — I10 Essential (primary) hypertension: Secondary | ICD-10-CM | POA: Insufficient documentation

## 2017-03-31 DIAGNOSIS — R102 Pelvic and perineal pain: Secondary | ICD-10-CM | POA: Diagnosis present

## 2017-03-31 DIAGNOSIS — Z90710 Acquired absence of both cervix and uterus: Secondary | ICD-10-CM | POA: Insufficient documentation

## 2017-03-31 DIAGNOSIS — N3000 Acute cystitis without hematuria: Secondary | ICD-10-CM | POA: Insufficient documentation

## 2017-03-31 DIAGNOSIS — Z90722 Acquired absence of ovaries, bilateral: Secondary | ICD-10-CM | POA: Diagnosis not present

## 2017-03-31 DIAGNOSIS — Z79899 Other long term (current) drug therapy: Secondary | ICD-10-CM | POA: Insufficient documentation

## 2017-03-31 DIAGNOSIS — Z9079 Acquired absence of other genital organ(s): Secondary | ICD-10-CM | POA: Diagnosis not present

## 2017-03-31 DIAGNOSIS — F329 Major depressive disorder, single episode, unspecified: Secondary | ICD-10-CM | POA: Insufficient documentation

## 2017-03-31 DIAGNOSIS — F419 Anxiety disorder, unspecified: Secondary | ICD-10-CM | POA: Diagnosis not present

## 2017-03-31 LAB — POCT URINALYSIS DIP (DEVICE)
Bilirubin Urine: NEGATIVE
Glucose, UA: NEGATIVE mg/dL
Ketones, ur: NEGATIVE mg/dL
Nitrite: NEGATIVE
PROTEIN: NEGATIVE mg/dL
UROBILINOGEN UA: 0.2 mg/dL (ref 0.0–1.0)
pH: 5.5 (ref 5.0–8.0)

## 2017-03-31 MED ORDER — NITROFURANTOIN MONOHYD MACRO 100 MG PO CAPS: 100.0000 mg | ORAL_CAPSULE | Freq: Two times a day (BID) | ORAL | 0 refills | Status: AC
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
## 2017-03-31 NOTE — ED Provider Notes (Signed)
[place]    CSN: [id number] Arrival date & time: 03/31/17  1324     History   Chief Complaint Chief Complaint  Patient presents with  . Vaginal Pain    HPI Carrie Bryan is a 51 y.o. female.   Presents with dysuria x 4 days. She notes some vaginal discomfort when urinating but does not think she has actual discharge but "not sure". She has no fever, chills, or abdominal or pelvic pain. No N, V is noted.       Past Medical History:  Diagnosis Date  . Anxiety   . Bacterial vaginosis   . Depression   . Hypertension     Patient Active Problem List   Diagnosis Date Noted  . Fibroids 09/23/2014  . Post-operative state 09/23/2014    Past Surgical History:  Procedure Laterality Date  . ABDOMINAL HYSTERECTOMY N/A 09/23/2014   Procedure: HYSTERECTOMY ABDOMINAL;  Surgeon: Lavonia Drafts, MD;  Location: [facility];  Service: Gynecology;  Laterality: N/A;  Requested 09/23/14 @ 10:00a  . BILATERAL SALPINGECTOMY Bilateral 09/23/2014   Procedure: BILATERAL SALPINGECTOMY;  Surgeon: Lavonia Drafts, MD;  Location: [facility];  Service: Gynecology;  Laterality: Bilateral;  . CHOLECYSTECTOMY    . DILATION AND CURETTAGE OF UTERUS      OB History    Gravida Para Term Preterm AB Living   10 3 3   7 3    SAB TAB Ectopic Multiple Live Births   1 6             Home Medications    Prior to Admission medications   Medication Sig Start Date End Date Taking? Authorizing Provider  furosemide (LASIX) 20 MG tablet Take 25 mg by mouth.   Yes [provider]  hydrochlorothiazide (HYDRODIURIL) 25 MG tablet Take 25 mg by mouth daily.     Yes [provider]  potassium chloride SA (KLOR-CON M15) 15 MEQ tablet Take 15 mEq by mouth 2 (two) times daily.   Yes [provider]  sertraline (ZOLOFT) 25 MG tablet Take 25 mg by mouth daily.   Yes [provider]  benzonatate (TESSALON) 100 MG capsule Take 1 capsule (100 mg total) by mouth 3  (three) times daily as needed for cough. 05/21/16   Bettey Costa, PA  docusate sodium (COLACE) 100 MG capsule Take 1 capsule (100 mg total) by mouth 2 (two) times daily. Patient not taking: Reported on 11/10/2014 09/25/14   Lavonia Drafts, MD  ibuprofen (ADVIL,MOTRIN) 600 MG tablet Take 1 tablet (600 mg total) by mouth every 6 (six) hours as needed (mild pain). Patient not taking: Reported on 12/20/2015 09/25/14   Lavonia Drafts, MD  nitrofurantoin, macrocrystal-monohydrate, (MACROBID) 100 MG capsule Take 1 capsule (100 mg total) by mouth 2 (two) times daily for 7 days. 03/31/17 04/07/17  Bjorn Pippin, PA-C  oxyCODONE-acetaminophen (PERCOCET/ROXICET) 5-325 MG per tablet Take 1-2 tablets by mouth every 4 (four) hours as needed (moderate to severe pain (when tolerating fluids)). Patient not taking: Reported on 11/10/2014 09/25/14   Lavonia Drafts, MD    Family History No family history on file.  Social History Social History   Tobacco Use  . Smoking status: Never Smoker  . Smokeless tobacco: Never Used  Substance Use Topics  . Alcohol use: No  . Drug use: No     Allergies   Patient has no known allergies.   Review of Systems Review of Systems  Gastrointestinal: Negative for abdominal distention, nausea and vomiting.  Genitourinary: Positive for dysuria, urgency and vaginal pain. Negative for menstrual problem, vaginal bleeding and vaginal discharge.  Skin: Negative.   Neurological: Negative.      Physical Exam Triage Vital Signs ED Triage Vitals  Enc Vitals Group     BP 03/31/17 1352 137/65     Pulse Rate 03/31/17 1352 (!) 103     Resp 03/31/17 1352 18     Temp 03/31/17 1352 98.2 F (36.8 C)     Temp src --      SpO2 03/31/17 1352 100 %     Weight --      Height --      Head Circumference --      Peak Flow --      Pain Score 03/31/17 1354 7     Pain Loc --      Pain Edu? --      Excl. in [place]? --    No data found.  Updated Vital  Signs BP 137/65   Pulse (!) 103   Temp 98.2 F (36.8 C)   Resp 18   LMP 09/10/2014 (Approximate)   SpO2 100%   Visual Acuity Right Eye Distance:   Left Eye Distance:   Bilateral Distance:    Right Eye Near:   Left Eye Near:    Bilateral Near:     Physical Exam  Constitutional: She is oriented to person, place, and time. She appears well-developed and well-nourished. No distress.  Neurological: She is alert and oriented to person, place, and time.  Skin: Skin is warm and dry. She is not diaphoretic.  Psychiatric: Her behavior is normal.  Nursing note and vitals reviewed.    UC Treatments / Results  Labs (all labs ordered are listed, but only abnormal results are displayed) Labs Reviewed  POCT URINALYSIS DIP (DEVICE) - Abnormal; Notable for the following components:      Result Value   Hgb urine dipstick TRACE (*)    Leukocytes, UA TRACE (*)    All other components within normal limits  URINE CYTOLOGY ANCILLARY ONLY    EKG  EKG Interpretation None       Radiology No results found.  Procedures Procedures (including critical care time)  Medications Ordered in UC Medications - No data to display   Initial Impression / Assessment and Plan / UC Course  I have reviewed the triage vital signs and the nursing notes.  Pertinent labs & imaging results that were available during my care of the patient were reviewed by me and considered in my medical decision making (see chart for details).     Treat for UTI/test for BV and Yeast per patient request though symptomatically indicative of a UTI. FU as needed. Call if further medications are warranted.   Final Clinical Impressions(s) / UC Diagnoses   Final diagnoses:  Acute cystitis without hematuria    ED Discharge Orders        Ordered    nitrofurantoin, macrocrystal-monohydrate, (MACROBID) 100 MG capsule  2 times daily     03/31/17 1526       Controlled Substance Prescriptions [place] Controlled Substance  Registry consulted? Not Applicable   Prudencio Pair 03/31/17 1535

## 2017-03-31 NOTE — Discharge Instructions (Signed)
You have a UTI. We will run the urine for yeast and BV as well. Will go ahead and treat with antibiotic. Drink plenty of fluids as well. May use AZO OTC if needed for discomfort. If the test come back with further infection we will call you with instruction. Feel better.

## 2017-04-03 LAB — URINE CYTOLOGY ANCILLARY ONLY: Trichomonas: NEGATIVE

## 2017-04-04 LAB — URINE CYTOLOGY ANCILLARY ONLY
Bacterial vaginitis: NEGATIVE
CANDIDA VAGINITIS: NEGATIVE

## 2017-09-23 ENCOUNTER — Encounter (HOSPITAL_COMMUNITY): Payer: Self-pay

## 2017-09-23 ENCOUNTER — Ambulatory Visit (HOSPITAL_COMMUNITY)
Admission: EM | Admit: 2017-09-23 | Discharge: 2017-09-23 | Disposition: A | Discharge status: Home / Self Care | Payer: Medicaid Other | Attending: Family | Admitting: Family

## 2017-09-23 ENCOUNTER — Other Ambulatory Visit: Payer: Self-pay

## 2017-09-23 DIAGNOSIS — M25561 Pain in right knee: Secondary | ICD-10-CM | POA: Diagnosis not present

## 2017-09-23 MED ORDER — DICLOFENAC SODIUM 1 % TD GEL: 4.0000 g | Freq: Four times a day (QID) | TRANSDERMAL | 1 refills | Status: DC

## 2017-09-23 NOTE — ED Provider Notes (Signed)
[place]    CSN: [id number] Arrival date & time: 09/23/17  1825     History   Chief Complaint Chief Complaint  Patient presents with  . Knee Pain    HPI Carrie Bryan is a 52 y.o. female.   CC: right knee pain x 2 days, worsening.   Woke up with pain 2 days ago. Describes as ache, stiffness. Took ibuprofen without relief.   No h/o knee pain, DM, gout No recent surgeries. No SOB, leg swelling, calf pain.  Has stairs in apartment, pain with going and up and down. Hasnt given out or catching  No h/o autoimmune disease, RA.   Nonsmoker     Past Medical History:  Diagnosis Date  . Anxiety   . Bacterial vaginosis   . Depression   . Hypertension     Patient Active Problem List   Diagnosis Date Noted  . Fibroids 09/23/2014  . Post-operative state 09/23/2014    Past Surgical History:  Procedure Laterality Date  . ABDOMINAL HYSTERECTOMY N/A 09/23/2014   Procedure: HYSTERECTOMY ABDOMINAL;  Surgeon: Lavonia Drafts, MD;  Location: [facility];  Service: Gynecology;  Laterality: N/A;  Requested 09/23/14 @ 10:00a  . BILATERAL SALPINGECTOMY Bilateral 09/23/2014   Procedure: BILATERAL SALPINGECTOMY;  Surgeon: Lavonia Drafts, MD;  Location: [facility];  Service: Gynecology;  Laterality: Bilateral;  . CHOLECYSTECTOMY    . DILATION AND CURETTAGE OF UTERUS      OB History    Gravida  10   Para  3   Term  3   Preterm      AB  7   Living  3     SAB  1   TAB  6   Ectopic      Multiple      Live Births               Home Medications    Prior to Admission medications   Medication Sig Start Date End Date Taking? Authorizing Provider  furosemide (LASIX) 20 MG tablet Take 25 mg by mouth.   Yes [provider]  hydrochlorothiazide (HYDRODIURIL) 25 MG tablet Take 25 mg by mouth daily.     Yes [provider]  potassium chloride SA (KLOR-CON M15) 15 MEQ tablet Take 15 mEq by mouth 2 (two) times daily.   Yes [provider]  sertraline (ZOLOFT) 25 MG tablet Take 25 mg by mouth daily.   Yes [provider]  benzonatate (TESSALON) 100 MG capsule Take 1 capsule (100 mg total) by mouth 3 (three) times daily as needed for cough. 05/21/16   Bettey Costa, PA  diclofenac sodium (VOLTAREN) 1 % GEL Apply 4 g topically 4 (four) times daily. 09/23/17   Burnard Hawthorne, FNP  docusate sodium (COLACE) 100 MG capsule Take 1 capsule (100 mg total) by mouth 2 (two) times daily. Patient not taking: Reported on 11/10/2014 09/25/14   Lavonia Drafts, MD  ibuprofen (ADVIL,MOTRIN) 600 MG tablet Take 1 tablet (600 mg total) by mouth every 6 (six) hours as needed (mild pain). Patient not taking: Reported on 12/20/2015 09/25/14   Lavonia Drafts, MD  oxyCODONE-acetaminophen (PERCOCET/ROXICET) 5-325 MG per tablet Take 1-2 tablets by mouth every 4 (four) hours as needed (moderate to severe pain (when tolerating fluids)). Patient not taking: Reported on 11/10/2014 09/25/14   Lavonia Drafts, MD    Family History No family history on file.  Social History Social History   Tobacco Use  . Smoking status:  Never Smoker  . Smokeless tobacco: Never Used  Substance Use Topics  . Alcohol use: No  . Drug use: No     Allergies   Patient has no known allergies.   Review of Systems Review of Systems  Constitutional: Negative for chills and fever.  Respiratory: Negative for cough and shortness of breath.   Cardiovascular: Negative for chest pain, palpitations and leg swelling.  Gastrointestinal: Negative for nausea and vomiting.  Musculoskeletal: Positive for joint swelling.     Physical Exam Triage Vital Signs ED Triage Vitals  Enc Vitals Group     BP 09/23/17 1852 118/64     Pulse Rate 09/23/17 1852 87     Resp 09/23/17 1852 16     Temp 09/23/17 1852 98 F (36.7 C)     Temp Source 09/23/17 1852 Oral     SpO2 09/23/17 1852 99 %     Weight --      Height --      Head  Circumference --      Peak Flow --      Pain Score 09/23/17 1854 8     Pain Loc --      Pain Edu? --      Excl. in [place]? --    No data found.  Updated Vital Signs BP 118/64 (BP Location: Right Arm)   Pulse 87   Temp 98 F (36.7 C) (Oral)   Resp 16   LMP 09/10/2014 (Approximate)   SpO2 99%   Visual Acuity Right Eye Distance:   Left Eye Distance:   Bilateral Distance:    Right Eye Near:   Left Eye Near:    Bilateral Near:     Physical Exam  Constitutional: She appears well-developed and well-nourished.  Eyes: Conjunctivae are normal.  Cardiovascular: Normal rate, regular rhythm, normal heart sounds and normal pulses.  No LE edema, palpable cords or masses. No erythema or increased warmth. No asymmetry in calf size when compared bilaterally LE hair growth symmetric and present. No discoloration of varicosities noted. LE warm and palpable pedal pulses.   Pulmonary/Chest: Effort normal and breath sounds normal. She has no wheezes. She has no rhonchi. She has no rales.  Musculoskeletal:       Right knee: She exhibits effusion. She exhibits normal range of motion, no swelling and no erythema. Tenderness found. Medial joint line tenderness noted.       Legs: Trace right effusion noted when compared to left knee. Mild tenderness elicited over medial joint line.    Neurological: She is alert.  Skin: Skin is warm and dry.  Psychiatric: She has a normal mood and affect. Her speech is normal and behavior is normal. Thought content normal.  Vitals reviewed.    UC Treatments / Results  Labs (all labs ordered are listed, but only abnormal results are displayed) Labs Reviewed - No data to display  EKG None  Radiology No results found.  Procedures Procedures (including critical care time)  Medications Ordered in UC Medications - No data to display  Initial Impression / Assessment and Plan / UC Course  I have reviewed the triage vital signs and the nursing  notes.  Pertinent labs & imaging results that were available during my care of the patient were reviewed by me and considered in my medical decision making (see chart for details).     Final Clinical Impressions(s) / UC Diagnoses   Final diagnoses:  Acute pain of right knee  Working diagnosis of meniscal  or arthritic etiology.  Advised conservative management -topical NSAID, Ace wrap, ice therapy.  Advised patient to follow-up with PCP for further management.   Discharge Instructions     Suspect arthritic or meniscal etiology Ice is SO important. Twice a day for 20 minutes each day. Compression with ace wrap May trial voltaren gel for pain; if too expensive, may try icy hot, ben gay Any worsening of pain, swelling, please return for further evaluation    ED Prescriptions    Medication Sig Dispense Auth. Provider   diclofenac sodium (VOLTAREN) 1 % GEL Apply 4 g topically 4 (four) times daily. 1 Tube Burnard Hawthorne, FNP     Controlled Substance Prescriptions [place] Controlled Substance Registry consulted? Not Applicable   Burnard Hawthorne, FNP 09/23/17 Joen Laura

## 2017-09-23 NOTE — ED Triage Notes (Signed)
Pt presents today right knee pain that has been going on 2 days. Thinks it could be fluid build up. Has not had any recent injury that could have caused the pain.

## 2017-09-23 NOTE — Discharge Instructions (Signed)
Suspect arthritic or meniscal etiology Ice is SO important. Twice a day for 20 minutes each day. Compression with ace wrap May trial voltaren gel for pain; if too expensive, may try icy hot, ben gay Any worsening of pain, swelling, please return for further evaluation

## 2017-10-12 ENCOUNTER — Other Ambulatory Visit: Payer: Self-pay | Admitting: Internal Medicine

## 2017-10-12 DIAGNOSIS — Z1231 Encounter for screening mammogram for malignant neoplasm of breast: Secondary | ICD-10-CM

## 2017-10-12 DIAGNOSIS — E2839 Other primary ovarian failure: Secondary | ICD-10-CM

## 2017-11-24 ENCOUNTER — Other Ambulatory Visit: Payer: Medicaid Other

## 2017-11-24 ENCOUNTER — Ambulatory Visit: Payer: Medicaid Other

## 2018-01-10 ENCOUNTER — Ambulatory Visit: Payer: Medicaid Other

## 2018-01-10 ENCOUNTER — Other Ambulatory Visit: Payer: Medicaid Other

## 2018-03-02 ENCOUNTER — Ambulatory Visit
Admission: RE | Admit: 2018-03-02 | Discharge: 2018-03-02 | Disposition: A | Discharge status: Home / Self Care | Payer: Medicaid Other | Source: Ambulatory Visit | Attending: Internal Medicine | Admitting: Internal Medicine

## 2018-03-02 DIAGNOSIS — Z1231 Encounter for screening mammogram for malignant neoplasm of breast: Secondary | ICD-10-CM

## 2018-03-02 DIAGNOSIS — E2839 Other primary ovarian failure: Secondary | ICD-10-CM

## 2018-04-30 ENCOUNTER — Other Ambulatory Visit: Payer: Self-pay

## 2018-04-30 ENCOUNTER — Encounter (HOSPITAL_COMMUNITY): Payer: Self-pay | Admitting: Family Medicine

## 2018-04-30 ENCOUNTER — Ambulatory Visit (HOSPITAL_COMMUNITY)
Admission: EM | Admit: 2018-04-30 | Discharge: 2018-04-30 | Disposition: A | Discharge status: Home / Self Care | Payer: Medicaid Other | Attending: Family Medicine | Admitting: Family Medicine

## 2018-04-30 DIAGNOSIS — M545 Low back pain, unspecified: Secondary | ICD-10-CM

## 2018-04-30 LAB — POCT URINALYSIS DIP (DEVICE)
Bilirubin Urine: NEGATIVE
Glucose, UA: NEGATIVE mg/dL
KETONES UR: NEGATIVE mg/dL
LEUKOCYTES UA: NEGATIVE
Nitrite: NEGATIVE
PH: 6 (ref 5.0–8.0)
Protein, ur: NEGATIVE mg/dL
Specific Gravity, Urine: >=1.03 (ref 1.005–1.030)
Urobilinogen, UA: 0.2 mg/dL (ref 0.0–1.0)

## 2018-04-30 MED ORDER — DICLOFENAC SODIUM 75 MG PO TBEC: 75.0000 mg | DELAYED_RELEASE_TABLET | Freq: Two times a day (BID) | ORAL | 0 refills | Status: DC

## 2018-04-30 MED ORDER — FLUCONAZOLE 150 MG PO TABS: 150.0000 mg | ORAL_TABLET | Freq: Once | ORAL | 0 refills | Status: AC

## 2018-04-30 MED ORDER — FLUCONAZOLE 150 MG PO TABS: 150.0000 mg | ORAL_TABLET | Freq: Once | ORAL | 0 refills | Status: DC

## 2018-04-30 NOTE — ED Triage Notes (Signed)
Back pain for one week.  Pain is lower back. No known injury.    Patient relates this pain to std or uti

## 2018-04-30 NOTE — ED Provider Notes (Signed)
[place]    CSN: [id number] Arrival date & time: 04/30/18  1215     History   Chief Complaint Chief Complaint  Patient presents with  . Back Pain    HPI Carrie Bryan is a 53 y.o. female.   Is a 53 year old established patient.  Back pain for one week.  Pain is lower back. No known injury.    Patient relates this pain to possible Uti.  She has been having some urinary frequency, although she does take Lasix periodically.  Has been lifting a lot of laundry lately.  She only works at home.        Past Medical History:  Diagnosis Date  . Anxiety   . Bacterial vaginosis   . Depression   . Hypertension     Patient Active Problem List   Diagnosis Date Noted  . Fibroids 09/23/2014  . Post-operative state 09/23/2014    Past Surgical History:  Procedure Laterality Date  . ABDOMINAL HYSTERECTOMY N/A 09/23/2014   Procedure: HYSTERECTOMY ABDOMINAL;  Surgeon: Lavonia Drafts, MD;  Location: [facility];  Service: Gynecology;  Laterality: N/A;  Requested 09/23/14 @ 10:00a  . BILATERAL SALPINGECTOMY Bilateral 09/23/2014   Procedure: BILATERAL SALPINGECTOMY;  Surgeon: Lavonia Drafts, MD;  Location: [facility];  Service: Gynecology;  Laterality: Bilateral;  . CHOLECYSTECTOMY    . DILATION AND CURETTAGE OF UTERUS      OB History    Gravida  10   Para  3   Term  3   Preterm      AB  7   Living  3     SAB  1   TAB  6   Ectopic      Multiple      Live Births               Home Medications    Prior to Admission medications   Medication Sig Start Date End Date Taking? Authorizing Provider  furosemide (LASIX) 20 MG tablet Take 25 mg by mouth.   Yes [provider]  hydrochlorothiazide (HYDRODIURIL) 25 MG tablet Take 25 mg by mouth daily.     Yes [provider]  potassium chloride SA (KLOR-CON M15) 15 MEQ tablet Take 15 mEq by mouth 2 (two) times daily.   Yes [provider]  sertraline (ZOLOFT) 25 MG  tablet Take 25 mg by mouth daily.   Yes [provider]  diclofenac (VOLTAREN) 75 MG EC tablet Take 1 tablet (75 mg total) by mouth 2 (two) times daily. 04/30/18   Robyn Haber, MD  fluconazole (DIFLUCAN) 150 MG tablet Take 1 tablet (150 mg total) by mouth once for 1 dose. Repeat if needed 04/30/18 04/30/18  Robyn Haber, MD    Family History History reviewed. No pertinent family history.  Social History Social History   Tobacco Use  . Smoking status: Never Smoker  . Smokeless tobacco: Never Used  Substance Use Topics  . Alcohol use: No  . Drug use: No     Allergies   Patient has no known allergies.   Review of Systems Review of Systems   Physical Exam Triage Vital Signs ED Triage Vitals  Enc Vitals Group     BP      Pulse      Resp      Temp      Temp src      SpO2      Weight      Height  Head Circumference      Peak Flow      Pain Score      Pain Loc      Pain Edu?      Excl. in [place]?    No data found.  Updated Vital Signs BP (!) 115/58 (BP Location: Right Arm) Comment (BP Location): large cuff  Pulse 71   Temp 97.9 F (36.6 C) (Temporal)   Resp 20   LMP 09/10/2014 (Approximate)   SpO2 98%    Physical Exam Vitals signs and nursing note reviewed.  Constitutional:      Appearance: Normal appearance. She is obese.  HENT:     Head: Normocephalic.     Right Ear: External ear normal.     Left Ear: External ear normal.     Nose: Nose normal.     Mouth/Throat:     Pharynx: Oropharynx is clear.  Eyes:     Conjunctiva/sclera: Conjunctivae normal.  Neck:     Musculoskeletal: Normal range of motion and neck supple.  Cardiovascular:     Pulses: Normal pulses.     Heart sounds: Normal heart sounds.  Pulmonary:     Effort: Pulmonary effort is normal.     Breath sounds: Normal breath sounds.  Musculoskeletal: Normal range of motion.        General: No swelling, tenderness or deformity.  Skin:    General: Skin is warm and dry.    Neurological:     General: No focal deficit present.     Mental Status: She is alert.     Comments: Negative straight leg raising  Psychiatric:        Mood and Affect: Mood normal.      UC Treatments / Results  Labs (all labs ordered are listed, but only abnormal results are displayed) Labs Reviewed  POCT URINALYSIS DIP (DEVICE) - Abnormal; Notable for the following components:      Result Value   Hgb urine dipstick TRACE (*)    All other components within normal limits  URINE CULTURE    EKG None  Radiology No results found.  Procedures Procedures (including critical care time)  Medications Ordered in UC Medications - No data to display  Initial Impression / Assessment and Plan / UC Course  I have reviewed the triage vital signs and the nursing notes.  Pertinent labs & imaging results that were available during my care of the patient were reviewed by me and considered in my medical decision making (see chart for details).    Final Clinical Impressions(s) / UC Diagnoses   Final diagnoses:  Acute midline low back pain without sciatica   Discharge Instructions   None    ED Prescriptions    Medication Sig Dispense Auth. Provider   diclofenac (VOLTAREN) 75 MG EC tablet  (Status: Discontinued) Take 1 tablet (75 mg total) by mouth 2 (two) times daily. 14 tablet Robyn Haber, MD   fluconazole (DIFLUCAN) 150 MG tablet  (Status: Discontinued) Take 1 tablet (150 mg total) by mouth once for 1 dose. Repeat if needed 2 tablet Robyn Haber, MD   diclofenac (VOLTAREN) 75 MG EC tablet Take 1 tablet (75 mg total) by mouth 2 (two) times daily. 14 tablet Robyn Haber, MD   fluconazole (DIFLUCAN) 150 MG tablet Take 1 tablet (150 mg total) by mouth once for 1 dose. Repeat if needed 2 tablet Robyn Haber, MD     Controlled Substance Prescriptions [place] Controlled Substance Registry consulted? Not Applicable   Smriti Barkow,  Synetta Shadow, MD 04/30/18 1359

## 2018-05-01 LAB — URINE CULTURE: Culture: NO GROWTH

## 2018-06-12 ENCOUNTER — Encounter (HOSPITAL_COMMUNITY): Payer: Self-pay | Admitting: Emergency Medicine

## 2018-06-12 ENCOUNTER — Ambulatory Visit (HOSPITAL_COMMUNITY)
Admission: EM | Admit: 2018-06-12 | Discharge: 2018-06-12 | Disposition: A | Discharge status: Home / Self Care | Payer: Medicaid Other | Attending: Family Medicine | Admitting: Family Medicine

## 2018-06-12 DIAGNOSIS — N898 Other specified noninflammatory disorders of vagina: Secondary | ICD-10-CM

## 2018-06-12 DIAGNOSIS — M545 Low back pain, unspecified: Secondary | ICD-10-CM

## 2018-06-12 MED ORDER — CYCLOBENZAPRINE HCL 10 MG PO TABS: 10.0000 mg | ORAL_TABLET | Freq: Every day | ORAL | 0 refills | Status: DC

## 2018-06-12 MED ORDER — FLUCONAZOLE 200 MG PO TABS: ORAL_TABLET | ORAL | 0 refills | Status: DC

## 2018-06-12 MED ORDER — ACETAMINOPHEN 500 MG PO TABS: 500.0000 mg | ORAL_TABLET | Freq: Four times a day (QID) | ORAL | 0 refills | Status: DC | PRN

## 2018-06-12 NOTE — Discharge Instructions (Addendum)
Continue conservative management of rest, ice, and gentle stretches Take tylenol extra strength as needed for pain Take cyclobenzaprine at nighttime for symptomatic relief. Avoid driving or operating heavy machinery while using medication. Follow up with PCP if symptoms persist Return or go to the ER if you have any new or worsening symptoms (fever, chills, chest pain, abdominal pain, changes in bowel or bladder habits, pain radiating into lower legs, etc...)   Diflucan prescribed.  Take as directed Follow up with PCP if symptoms persists Return or go to the ED if you have any new or worsening symptoms such as vaginal bleeding, vaginal discharge, vaginal odor, painful intercourse, vaginal rash or lesions, etc..Marland Kitchen

## 2018-06-12 NOTE — ED Triage Notes (Signed)
Pt states she pulled something in her back a week ago while carrying a laundry basket. Pt also c/o vaginal itching.

## 2018-06-12 NOTE — ED Provider Notes (Signed)
[place]   [id number] 06/12/18 Arrival Time: 2025  CC: Back pain and vaginal itching  SUBJECTIVE: History from: patient. Carrie Bryan is a 53 y.o. female complains of back pain that began 1 week ago.  Symptoms began after carry a heavy laundry basket.  Localizes the pain to the low back.  Describes the pain as constant and dull in character.  Has tried OTC medications without relief.  Symptoms are made worse with bending forward.  Denies similar symptoms in the past.  Denies fever, chills, erythema, ecchymosis, effusion, weakness, numbness and tingling.      Patient also mentions vaginal itching x few days.  Denies recent sexual activity.  Admits to change in soaps.  Has not tried OTC medications.  Denies aggravating factors.  Reports previous symptoms in the past and diagnosed with yeast infection.  Denies fever,chills, nausea, vomiting, abdominal or pelvic pain, changes in urinary or bowel habits, vaginal bleeding, vaginal discharge, vaginal odor, dyspareunia, vaginal rash or lesions.    ROS: As per HPI.  Past Medical History:  Diagnosis Date  . Anxiety   . Bacterial vaginosis   . Depression   . Hypertension    Past Surgical History:  Procedure Laterality Date  . ABDOMINAL HYSTERECTOMY N/A 09/23/2014   Procedure: HYSTERECTOMY ABDOMINAL;  Surgeon: Lavonia Drafts, MD;  Location: [facility];  Service: Gynecology;  Laterality: N/A;  Requested 09/23/14 @ 10:00a  . BILATERAL SALPINGECTOMY Bilateral 09/23/2014   Procedure: BILATERAL SALPINGECTOMY;  Surgeon: Lavonia Drafts, MD;  Location: [facility];  Service: Gynecology;  Laterality: Bilateral;  . CHOLECYSTECTOMY    . DILATION AND CURETTAGE OF UTERUS     No Known Allergies No current facility-administered medications on file prior to encounter.    Current Outpatient Medications on File Prior to Encounter  Medication Sig Dispense Refill  . diclofenac (VOLTAREN) 75 MG EC tablet Take 1 tablet (75 mg total) by mouth 2  (two) times daily. 14 tablet 0  . furosemide (LASIX) 20 MG tablet Take 25 mg by mouth.    . hydrochlorothiazide (HYDRODIURIL) 25 MG tablet Take 25 mg by mouth daily.      . potassium chloride SA (KLOR-CON M15) 15 MEQ tablet Take 15 mEq by mouth 2 (two) times daily.    . sertraline (ZOLOFT) 25 MG tablet Take 25 mg by mouth daily.     Social History   Socioeconomic History  . Marital status: Divorced    Spouse name: Not on file  . Number of children: Not on file  . Years of education: Not on file  . Highest education level: Not on file  Occupational History  . Not on file  Social Needs  . Financial resource strain: Not on file  . Food insecurity:    Worry: Not on file    Inability: Not on file  . Transportation needs:    Medical: Not on file    Non-medical: Not on file  Tobacco Use  . Smoking status: Never Smoker  . Smokeless tobacco: Never Used  Substance and Sexual Activity  . Alcohol use: No  . Drug use: No  . Sexual activity: Never  Lifestyle  . Physical activity:    Days per week: Not on file    Minutes per session: Not on file  . Stress: Not on file  Relationships  . Social connections:    Talks on phone: Not on file    Gets together: Not on file    Attends religious service: Not on file  Active member of club or organization: Not on file    Attends meetings of clubs or organizations: Not on file    Relationship status: Not on file  . Intimate partner violence:    Fear of current or ex partner: Not on file    Emotionally abused: Not on file    Physically abused: Not on file    Forced sexual activity: Not on file  Other Topics Concern  . Not on file  Social History Narrative  . Not on file   No family history on file.  OBJECTIVE:  Vitals:   06/12/18 1341  BP: 124/74  Pulse: 90  Resp: 18  Temp: 97.9 F (36.6 C)  SpO2: 98%    General appearance: Alert; in no acute distress.  Head: NCAT Lungs: CTA bilaterally Heart: RRR.   Radial pulses 2+  bilaterally. Musculoskeletal: Back  Inspection: Skin warm, dry, clear and intact without obvious erythema, effusion, or ecchymosis.  Palpation: Diffusely TTP over RT and LT low back ROM: FROM active and passive Strength: 5/5 shld abduction, 5/5 shld adduction, 5/5 elbow flexion, 5/5 elbow extension, 5/5 grip strength, 5/5 hip flexion, 5/5 knee abduction, 5/5 knee adduction, 5/5 knee flexion, 5/5 knee extension, 5/5 dorsiflexion, 5/5 plantar flexion Skin: warm and dry Neurologic: Ambulates without difficulty; Sensation intact about the upper/ lower extremities Psychological: alert and cooperative; normal mood and affect  ASSESSMENT & PLAN:  1. Acute bilateral low back pain without sciatica   2. Vaginal itching      Meds ordered this encounter  Medications  . cyclobenzaprine (FLEXERIL) 10 MG tablet    Sig: Take 1 tablet (10 mg total) by mouth at bedtime.    Dispense:  12 tablet    Refill:  0    Order Specific Question:   Supervising Provider    Answer:   Raylene Everts [[id number]]  . fluconazole (DIFLUCAN) 200 MG tablet    Sig: Take one dose by mouth, wait 72 hours, and then take second dose by mouth    Dispense:  2 tablet    Refill:  0    Order Specific Question:   Supervising Provider    Answer:   Raylene Everts [[id number]]  . acetaminophen (TYLENOL) 500 MG tablet    Sig: Take 1 tablet (500 mg total) by mouth every 6 (six) hours as needed.    Dispense:  30 tablet    Refill:  0    Order Specific Question:   Supervising Provider    Answer:   Raylene Everts [[id number]]    Continue conservative management of rest, ice, and gentle stretches Take tylenol extra strength as needed for pain Take cyclobenzaprine at nighttime for symptomatic relief. Avoid driving or operating heavy machinery while using medication. Follow up with PCP if symptoms persist Return or go to the ER if you have any new or worsening symptoms (fever, chills, chest pain, abdominal pain, changes in bowel  or bladder habits, pain radiating into lower legs, etc...)   Diflucan prescribed.  Take as directed Follow up with PCP if symptoms persists Return or go to the ED if you have any new or worsening symptoms such as vaginal bleeding, vaginal discharge, vaginal odor, painful intercourse, vaginal rash or lesions, etc...  Reviewed expectations re: course of current medical issues. Questions answered. Outlined signs and symptoms indicating need for more acute intervention. Patient verbalized understanding. After Visit Summary given.    Lestine Box, PA-C 06/12/18 1416

## 2018-07-31 ENCOUNTER — Ambulatory Visit (HOSPITAL_COMMUNITY)
Admission: EM | Admit: 2018-07-31 | Discharge: 2018-07-31 | Disposition: A | Discharge status: Home / Self Care | Payer: Medicaid Other | Attending: Family Medicine | Admitting: Family Medicine

## 2018-07-31 ENCOUNTER — Encounter (HOSPITAL_COMMUNITY): Payer: Self-pay | Admitting: Emergency Medicine

## 2018-07-31 ENCOUNTER — Other Ambulatory Visit: Payer: Self-pay

## 2018-07-31 DIAGNOSIS — R300 Dysuria: Secondary | ICD-10-CM | POA: Insufficient documentation

## 2018-07-31 LAB — POCT URINALYSIS DIP (DEVICE)
Bilirubin Urine: NEGATIVE
Glucose, UA: NEGATIVE mg/dL
Hgb urine dipstick: NEGATIVE
Ketones, ur: NEGATIVE mg/dL
Leukocytes,Ua: NEGATIVE
Nitrite: NEGATIVE
Protein, ur: NEGATIVE mg/dL
Specific Gravity, Urine: 1.02 (ref 1.005–1.030)
Urobilinogen, UA: 1 mg/dL (ref 0.0–1.0)
pH: 7.5 (ref 5.0–8.0)

## 2018-07-31 MED ORDER — NITROFURANTOIN MONOHYD MACRO 100 MG PO CAPS: 100.0000 mg | ORAL_CAPSULE | Freq: Two times a day (BID) | ORAL | 0 refills | Status: DC

## 2018-07-31 MED ORDER — FLUCONAZOLE 150 MG PO TABS: 150.0000 mg | ORAL_TABLET | Freq: Every day | ORAL | 0 refills | Status: DC

## 2018-07-31 NOTE — ED Triage Notes (Signed)
Pt here for UTI sx x 3 days.

## 2018-07-31 NOTE — ED Provider Notes (Signed)
[facility]    CSN: [id number] Arrival date & time: 07/31/18  1452     History   Chief Complaint Chief Complaint  Patient presents with  . Urinary Tract Infection    HPI Carrie Bryan is a 53 y.o. female.   HPI  Patient is here for 3 days of urinary tract infection symptoms.  She states she has some suprapubic pressure.  She feels like she cannot empty her bladder fully.  10 minutes after she urinates, she feels like she needs to urinate again.  No dysuria.  No hematuria.  No flank pain.  No nausea vomiting, no fever or chills.  She states this is what her last bladder infection felt like.  She states she has had recurring bladder infections.  Denies any vaginal itching or discharge.  Past Medical History:  Diagnosis Date  . Anxiety   . Bacterial vaginosis   . Depression   . Hypertension     Patient Active Problem List   Diagnosis Date Noted  . Fibroids 09/23/2014  . Post-operative state 09/23/2014    Past Surgical History:  Procedure Laterality Date  . ABDOMINAL HYSTERECTOMY N/A 09/23/2014   Procedure: HYSTERECTOMY ABDOMINAL;  Surgeon: Lavonia Drafts, MD;  Location: [facility];  Service: Gynecology;  Laterality: N/A;  Requested 09/23/14 @ 10:00a  . BILATERAL SALPINGECTOMY Bilateral 09/23/2014   Procedure: BILATERAL SALPINGECTOMY;  Surgeon: Lavonia Drafts, MD;  Location: [facility];  Service: Gynecology;  Laterality: Bilateral;  . CHOLECYSTECTOMY    . DILATION AND CURETTAGE OF UTERUS      OB History    Gravida  10   Para  3   Term  3   Preterm      AB  7   Living  3     SAB  1   TAB  6   Ectopic      Multiple      Live Births               Home Medications    Prior to Admission medications   Medication Sig Start Date End Date Taking? Authorizing Provider  acetaminophen (TYLENOL) 500 MG tablet Take 1 tablet (500 mg total) by mouth every 6 (six) hours as needed. 06/12/18   Wurst, [place], PA-C  cyclobenzaprine (FLEXERIL) 10  MG tablet Take 1 tablet (10 mg total) by mouth at bedtime. 06/12/18   Wurst, [place], PA-C  diclofenac (VOLTAREN) 75 MG EC tablet Take 1 tablet (75 mg total) by mouth 2 (two) times daily. 04/30/18   Robyn Haber, MD  fluconazole (DIFLUCAN) 150 MG tablet Take 1 tablet (150 mg total) by mouth daily. Repeat in 1 week if needed 07/31/18   Raylene Everts, MD  furosemide (LASIX) 20 MG tablet Take 25 mg by mouth.    [provider]  hydrochlorothiazide (HYDRODIURIL) 25 MG tablet Take 25 mg by mouth daily.      [provider]  nitrofurantoin, macrocrystal-monohydrate, (MACROBID) 100 MG capsule Take 1 capsule (100 mg total) by mouth 2 (two) times daily. 07/31/18   Raylene Everts, MD  potassium chloride SA (KLOR-CON M15) 15 MEQ tablet Take 15 mEq by mouth 2 (two) times daily.    [provider]  sertraline (ZOLOFT) 25 MG tablet Take 25 mg by mouth daily.    [provider]    Family History History reviewed. No pertinent family history.  Social History Social History   Tobacco Use  . Smoking status: Never Smoker  .  Smokeless tobacco: Never Used  Substance Use Topics  . Alcohol use: No  . Drug use: No     Allergies   Patient has no known allergies.   Review of Systems Review of Systems  Constitutional: Negative for chills and fever.  HENT: Negative for ear pain and sore throat.   Eyes: Negative for pain and visual disturbance.  Respiratory: Negative for cough and shortness of breath.   Cardiovascular: Negative for chest pain and palpitations.  Gastrointestinal: Negative for abdominal pain, nausea and vomiting.  Genitourinary: Positive for difficulty urinating and frequency. Negative for dysuria and hematuria.  Musculoskeletal: Negative for arthralgias and back pain.  Skin: Negative for color change and rash.  Neurological: Negative for seizures and syncope.  All other systems reviewed and are negative.    Physical Exam Triage Vital  Signs ED Triage Vitals [07/31/18 1521]  Enc Vitals Group     BP (!) 150/87     Pulse Rate 81     Resp 18     Temp 98.2 F (36.8 C)     Temp Source Oral     SpO2 97 %     Weight      Height      Head Circumference      Peak Flow      Pain Score 4     Pain Loc      Pain Edu?      Excl. in [place]?    No data found.  Updated Vital Signs BP (!) 150/87 (BP Location: Left Arm)   Pulse 81   Temp 98.2 F (36.8 C) (Oral)   Resp 18   LMP 09/10/2014 (Approximate)   SpO2 97%   Visual Acuity Right Eye Distance:   Left Eye Distance:   Bilateral Distance:    Right Eye Near:   Left Eye Near:    Bilateral Near:     Physical Exam Constitutional:      General: She is not in acute distress.    Appearance: Normal appearance. She is well-developed.  HENT:     Head: Normocephalic and atraumatic.     Right Ear: Tympanic membrane and ear canal normal.     Left Ear: Tympanic membrane and ear canal normal.     Nose: Nose normal. No congestion.     Mouth/Throat:     Mouth: Mucous membranes are moist.     Pharynx: No posterior oropharyngeal erythema.  Eyes:     Extraocular Movements: Extraocular movements intact.     Conjunctiva/sclera: Conjunctivae normal.     Pupils: Pupils are equal, round, and reactive to light.  Neck:     Musculoskeletal: Normal range of motion and neck supple.  Cardiovascular:     Rate and Rhythm: Normal rate and regular rhythm.     Heart sounds: Normal heart sounds.  Pulmonary:     Effort: Pulmonary effort is normal. No respiratory distress.     Breath sounds: Normal breath sounds.  Abdominal:     General: There is no distension.     Palpations: Abdomen is soft.     Comments: Abdomen is benign.  No CVA tenderness  Musculoskeletal: Normal range of motion.  Skin:    General: Skin is warm and dry.  Neurological:     Mental Status: She is alert.      UC Treatments / Results  Labs (all labs ordered are listed, but only abnormal results are displayed)  Labs Reviewed  URINE CULTURE  POCT URINALYSIS DIP (DEVICE)  EKG None  Radiology No results found.  Procedures Procedures (including critical care time)  Medications Ordered in UC Medications - No data to display  Initial Impression / Assessment and Plan / UC Course  I have reviewed the triage vital signs and the nursing notes.  Pertinent labs & imaging results that were available during my care of the patient were reviewed by me and considered in my medical decision making (see chart for details).     The urinalysis is completely normal.  This would go against bladder infection.  Abdomen a while she would have difficulty emptying her bladder.  She had a hysterectomy at a young age.  Will culture her urine and give her a few days of Macrobid.  She is advised that if her urine culture comes back negative, she needs to follow-up with GYN or urology for bladder studies Final Clinical Impressions(s) / UC Diagnoses   Final diagnoses:  Dysuria     Discharge Instructions     Drink plenty of water Take the antibiotic 2 x a day until gone Take the diflucan if needed  We did lab testing during this visit.  If there are any abnormal findings that require change in medicine or indicate a positive result, you will be notified.  If all of your tests are normal, you will not be called.   Follow up with yourPCP   ED Prescriptions    Medication Sig Dispense Auth. Provider   nitrofurantoin, macrocrystal-monohydrate, (MACROBID) 100 MG capsule Take 1 capsule (100 mg total) by mouth 2 (two) times daily. 10 capsule Raylene Everts, MD   fluconazole (DIFLUCAN) 150 MG tablet Take 1 tablet (150 mg total) by mouth daily. Repeat in 1 week if needed 2 tablet Raylene Everts, MD     Controlled Substance Prescriptions [place] Controlled Substance Registry consulted? Not Applicable   Raylene Everts, MD 07/31/18 [phone]

## 2018-07-31 NOTE — Discharge Instructions (Signed)
Drink plenty of water Take the antibiotic 2 x a day until gone Take the diflucan if needed  We did lab testing during this visit.  If there are any abnormal findings that require change in medicine or indicate a positive result, you will be notified.  If all of your tests are normal, you will not be called.   Follow up with yourPCP

## 2018-08-01 LAB — URINE CULTURE

## 2019-02-27 IMAGING — MG DIGITAL SCREENING BILATERAL MAMMOGRAM WITH TOMO AND CAD
6 of 12 series · 6 of 36 positions shown · non-contrast
Comparison: Previous exam(s).

CLINICAL DATA: Screening.

EXAM:
DIGITAL SCREENING BILATERAL MAMMOGRAM WITH TOMO AND CAD

[R CC synth-2D]
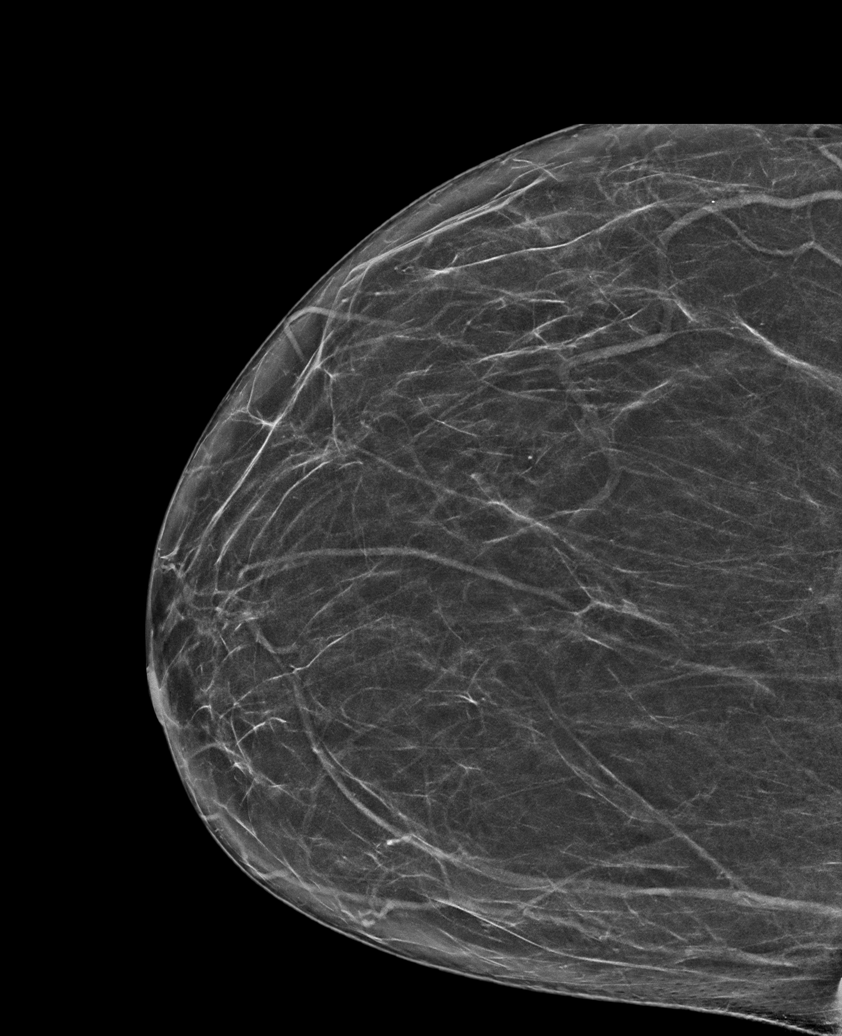

[R MLO synth-2D]
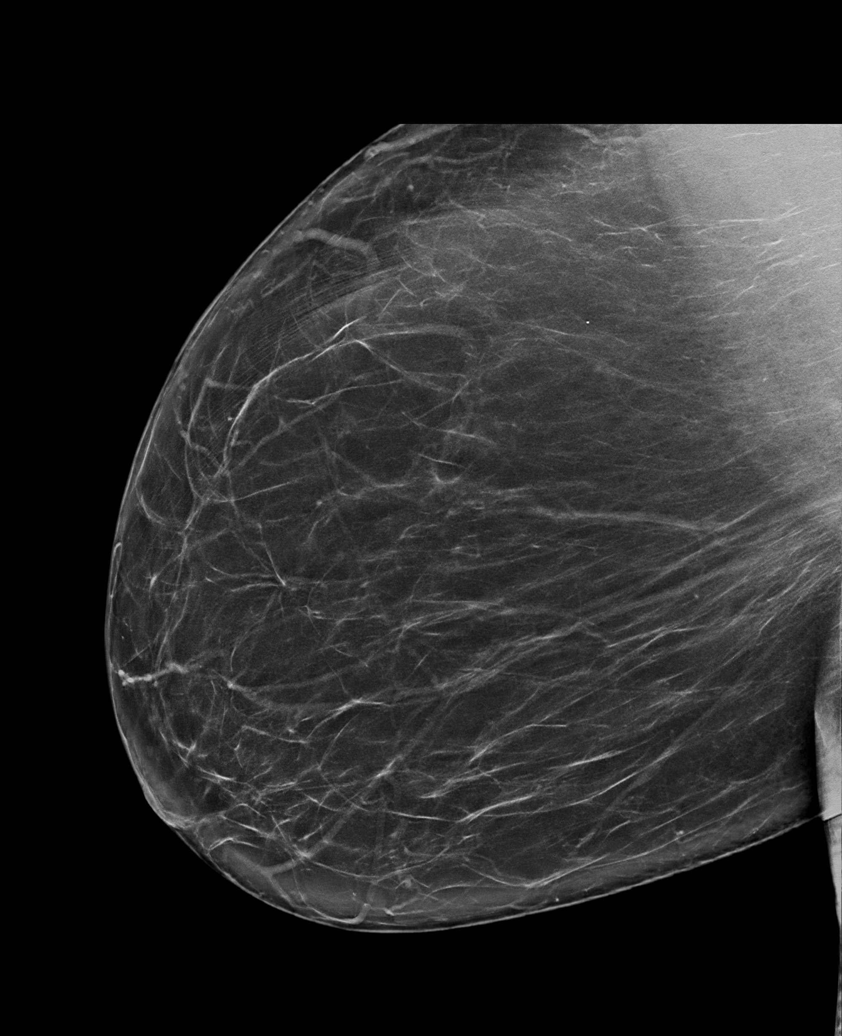

[L MLO synth-2D (1 of 2)]
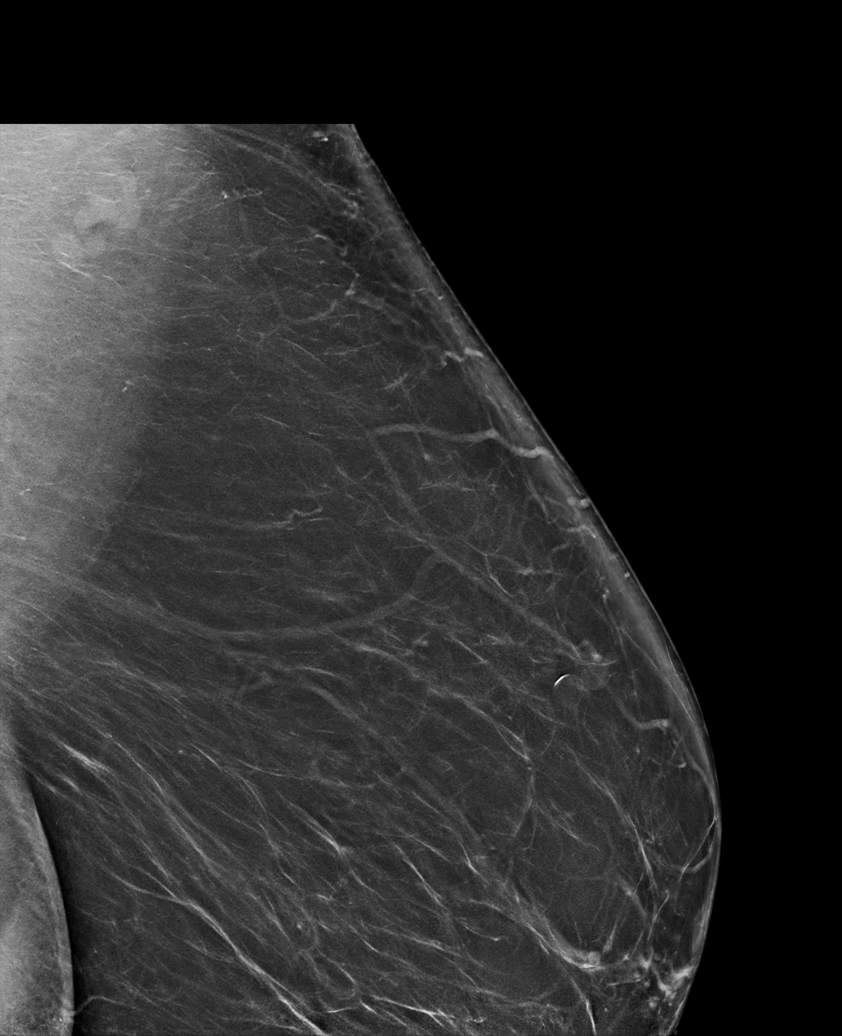

[L MLO synth-2D (2 of 2)]
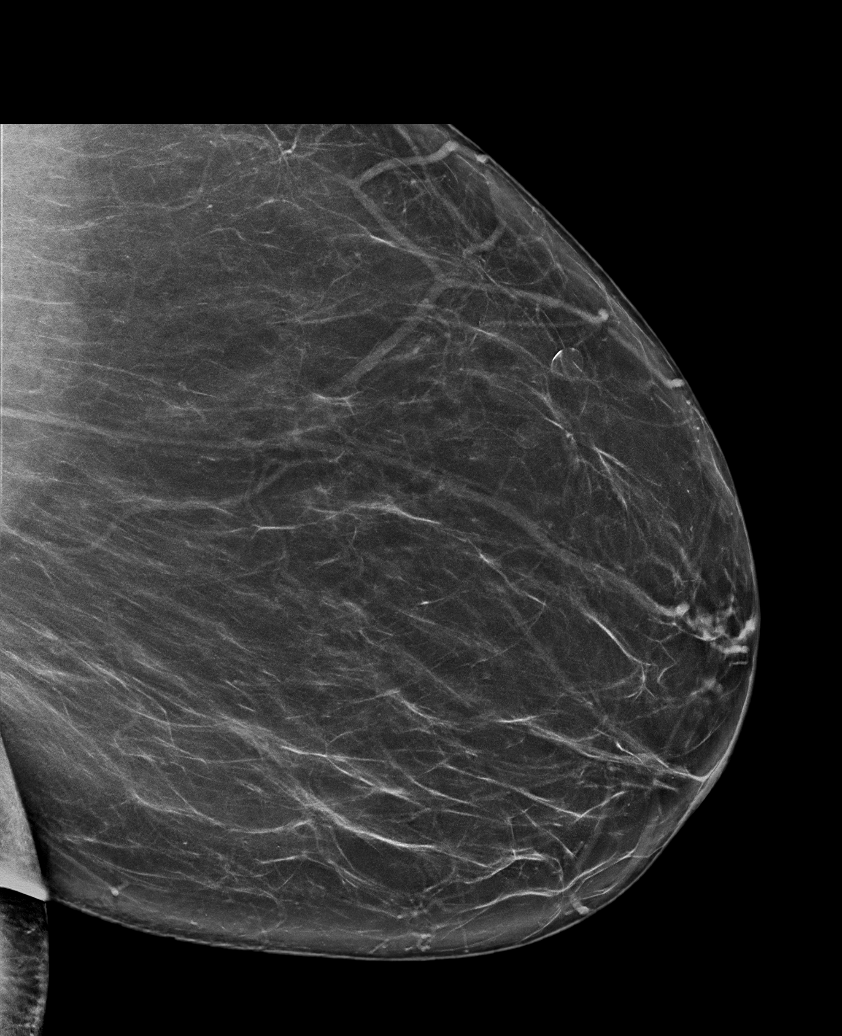

[L CV synth-2D]
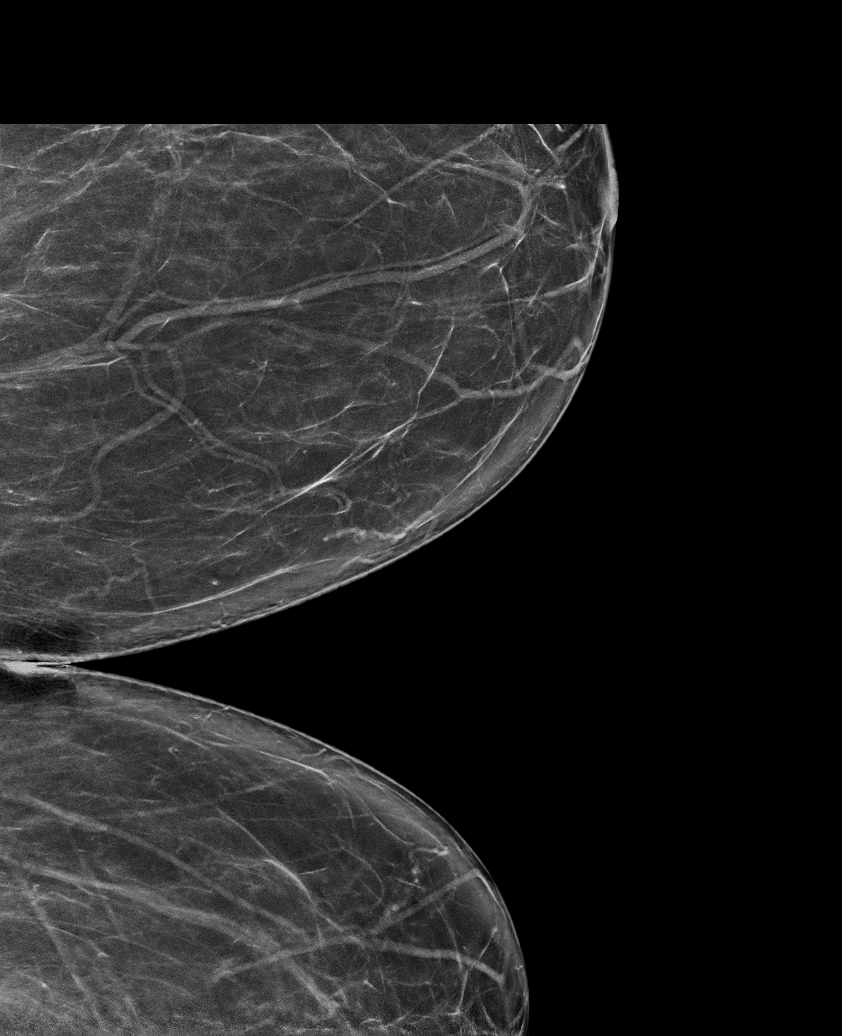

[L CC synth-2D]
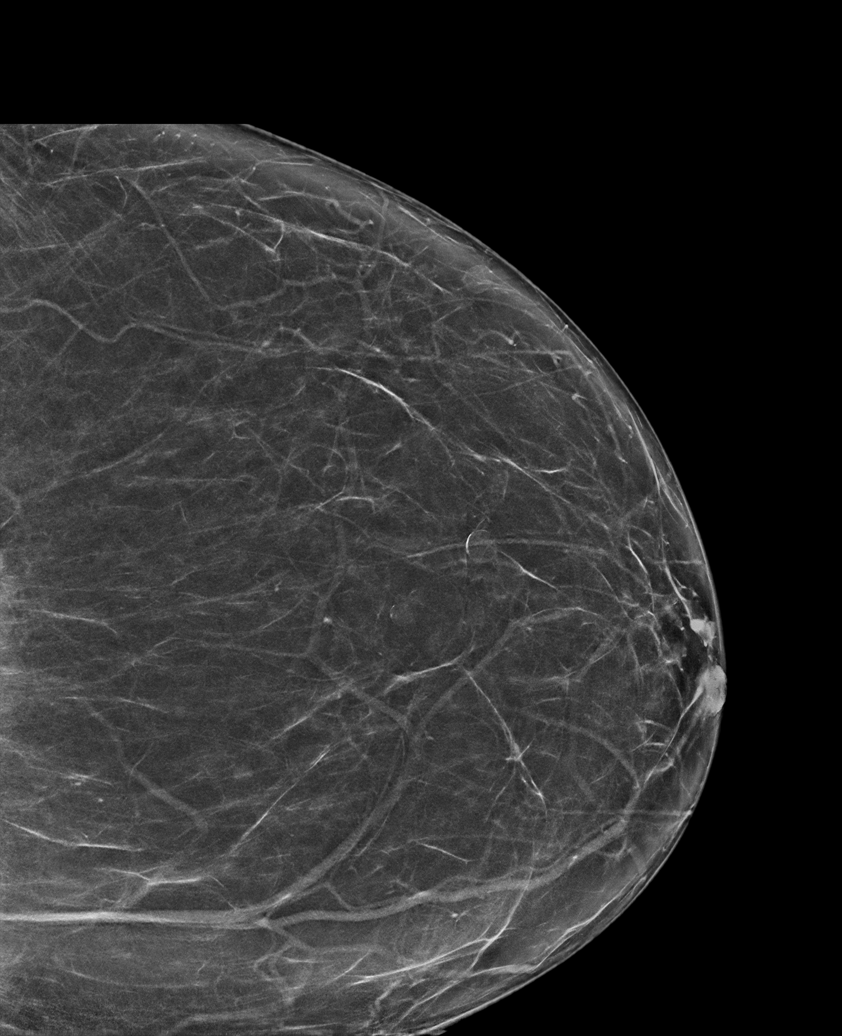

[6 of 36 positions shown; findings below may reference images not displayed]

ACR Breast Density Category b: There are scattered areas of
fibroglandular density.
FINDINGS: There are no findings suspicious for malignancy. Images were
processed with CAD.
IMPRESSION: No mammographic evidence of malignancy. A result letter of this
screening mammogram will be mailed directly to the patient.

RECOMMENDATION:
Screening mammogram in one year. (Code:CN-U-775)

BI-RADS CATEGORY  1: Negative.

## 2019-05-08 ENCOUNTER — Ambulatory Visit: Payer: Medicaid Other | Admitting: Obstetrics and Gynecology

## 2019-05-27 ENCOUNTER — Ambulatory Visit: Payer: Medicaid Other | Admitting: Family Medicine

## 2019-05-27 ENCOUNTER — Encounter: Payer: Self-pay | Admitting: Family Medicine

## 2019-05-27 NOTE — Progress Notes (Signed)
Patient did not keep appointment today. She may call to reschedule.  

## 2019-07-01 ENCOUNTER — Telehealth: Payer: Self-pay | Admitting: Obstetrics and Gynecology

## 2019-07-01 ENCOUNTER — Ambulatory Visit: Payer: Medicaid Other | Admitting: Obstetrics and Gynecology

## 2019-07-01 NOTE — Telephone Encounter (Signed)
Attempted to reach patient three times on phone number listed in [place]. Due to changes made in the office, I was trying to reach her to get her to come in the office today at 3:30. On the third try a man answered the phone, and told me I had the wrong number. Please have patient update her information.

## 2019-07-02 ENCOUNTER — Ambulatory Visit: Payer: Medicaid Other | Admitting: Obstetrics and Gynecology

## 2019-07-25 ENCOUNTER — Ambulatory Visit: Payer: Medicaid Other | Attending: Internal Medicine

## 2019-08-08 ENCOUNTER — Ambulatory Visit: Payer: Medicaid Other | Attending: Family

## 2019-08-08 DIAGNOSIS — Z23 Encounter for immunization: Secondary | ICD-10-CM

## 2019-08-08 NOTE — Progress Notes (Signed)
   Covid-19 Vaccination Clinic  Name:  Carrie Bryan    MRN: [id number] DOB: 03/13/1966  08/08/2019  Ms. Ace was observed post Covid-19 immunization for 15 minutes without incident. She was provided with Vaccine Information Sheet and instruction to access the V-Safe system.   [name] was instructed to call 911 with any severe reactions post vaccine: Marland Kitchen Difficulty breathing  . Swelling of face and throat  . A fast heartbeat  . A bad rash all over body  . Dizziness and weakness   Immunizations Administered    Name Date Dose VIS Date Route   Moderna COVID-19 Vaccine 08/08/2019  1:05 PM 0.5 mL 03/2019 Intramuscular   Manufacturer: Moderna   Lot: [id number]   [place]:[id number]

## 2019-08-09 ENCOUNTER — Ambulatory Visit: Payer: Medicaid Other

## 2019-08-20 ENCOUNTER — Other Ambulatory Visit: Payer: Self-pay | Admitting: Internal Medicine

## 2019-08-20 DIAGNOSIS — Z1231 Encounter for screening mammogram for malignant neoplasm of breast: Secondary | ICD-10-CM

## 2019-08-23 ENCOUNTER — Ambulatory Visit: Payer: Medicaid Other

## 2019-09-03 ENCOUNTER — Ambulatory Visit: Payer: Medicaid Other

## 2019-09-03 ENCOUNTER — Ambulatory Visit: Payer: Medicaid Other | Attending: Family

## 2019-09-03 DIAGNOSIS — Z23 Encounter for immunization: Secondary | ICD-10-CM

## 2019-09-03 NOTE — Progress Notes (Signed)
   Covid-19 Vaccination Clinic  Name:  Carrie Bryan    MRN: [id number] DOB: May 17, 1965  09/03/2019  Carrie Bryan was observed post Covid-19 immunization for 15 minutes without incident. She was provided with Vaccine Information Sheet and instruction to access the V-Safe system.   Carrie Bryan was instructed to call 911 with any severe reactions post vaccine: Marland Kitchen Difficulty breathing  . Swelling of face and throat  . A fast heartbeat  . A bad rash all over body  . Dizziness and weakness   Immunizations Administered    Name Date Dose VIS Date Route   Moderna COVID-19 Vaccine 09/03/2019 12:59 PM 0.5 mL 03/2019 Intramuscular   Manufacturer: Moderna   Lot: [id number]   [place]:[id number]

## 2020-04-24 ENCOUNTER — Ambulatory Visit: Payer: Medicaid Other | Attending: Internal Medicine

## 2020-04-24 DIAGNOSIS — Z23 Encounter for immunization: Secondary | ICD-10-CM

## 2020-04-24 NOTE — Progress Notes (Signed)
   Covid-19 Vaccination Clinic  Name:  LYDA COLCORD    MRN: [id number] DOB: 1965/07/04  04/24/2020  Ms. Sampath was observed post Covid-19 immunization for 15 minutes without incident. She was provided with Vaccine Information Sheet and instruction to access the V-Safe system.   Ms. Hargrove was instructed to call 911 with any severe reactions post vaccine: Marland Kitchen Difficulty breathing  . Swelling of face and throat  . A fast heartbeat  . A bad rash all over body  . Dizziness and weakness   Immunizations Administered    Name Date Dose VIS Date Route   Moderna Covid-19 Booster Vaccine 04/24/2020  2:57 PM 0.25 mL 02/05/2020 Intramuscular   Manufacturer: Levan Hurst   Lot: [id number]   [place]: [id number]

## 2020-05-19 ENCOUNTER — Other Ambulatory Visit: Payer: Self-pay

## 2020-05-27 ENCOUNTER — Other Ambulatory Visit: Payer: Self-pay | Admitting: Internal Medicine

## 2020-05-28 LAB — COMPLETE METABOLIC PANEL WITH GFR
AG Ratio: 1.3 (calc) (ref 1.0–2.5)
ALT: 15 U/L (ref 6–29)
AST: 18 U/L (ref 10–35)
Albumin: 4 g/dL (ref 3.6–5.1)
Alkaline phosphatase (APISO): 91 U/L (ref 37–153)
BUN/Creatinine Ratio: 12 (calc) (ref 6–22)
BUN: 13 mg/dL (ref 7–25)
CO2: 28 mmol/L (ref 20–32)
Calcium: 9.2 mg/dL (ref 8.6–10.4)
Chloride: 103 mmol/L (ref 98–110)
Creat: 1.09 mg/dL — ABNORMAL HIGH (ref 0.50–1.05)
GFR, Est African American: 66 mL/min/{1.73_m2} (ref >=60)
GFR, Est Non African American: 57 mL/min/{1.73_m2} — ABNORMAL LOW (ref >=60)
Globulin: 3 g/dL (calc) (ref 1.9–3.7)
Glucose, Bld: 86 mg/dL (ref 65–99)
Potassium: 4 mmol/L (ref 3.5–5.3)
Sodium: 140 mmol/L (ref 135–146)
Total Bilirubin: 1 mg/dL (ref 0.2–1.2)
Total Protein: 7 g/dL (ref 6.1–8.1)

## 2020-05-28 LAB — VITAMIN D 25 HYDROXY (VIT D DEFICIENCY, FRACTURES): Vit D, 25-Hydroxy: 63 ng/mL (ref 30–100)

## 2020-05-28 LAB — CBC
HCT: 38.8 % (ref 35.0–45.0)
Hemoglobin: 13.4 g/dL (ref 11.7–15.5)
MCH: 32.6 pg (ref 27.0–33.0)
MCHC: 34.5 g/dL (ref 32.0–36.0)
MCV: 94.4 fL (ref 80.0–100.0)
MPV: 11.1 fL (ref 7.5–12.5)
Platelets: 228 10*3/uL (ref 140–400)
RBC: 4.11 10*6/uL (ref 3.80–5.10)
RDW: 12.5 % (ref 11.0–15.0)
WBC: 5.1 10*3/uL (ref 3.8–10.8)

## 2020-05-28 LAB — LIPID PANEL
Cholesterol: 211 mg/dL — ABNORMAL HIGH (ref <200)
HDL: 51 mg/dL (ref >=50)
LDL Cholesterol (Calc): 130 mg/dL (calc) — ABNORMAL HIGH
Non-HDL Cholesterol (Calc): 160 mg/dL (calc) — ABNORMAL HIGH (ref <130)
Total CHOL/HDL Ratio: 4.1 (calc) (ref <5.0)
Triglycerides: 163 mg/dL — ABNORMAL HIGH (ref <150)

## 2020-05-28 LAB — TSH: TSH: 1.1 mIU/L

## 2020-05-28 LAB — URIC ACID: Uric Acid, Serum: 6.1 mg/dL (ref 2.5–7.0)

## 2020-06-25 ENCOUNTER — Ambulatory Visit (INDEPENDENT_AMBULATORY_CARE_PROVIDER_SITE_OTHER): Payer: Medicaid Other | Admitting: Podiatry

## 2020-06-25 ENCOUNTER — Other Ambulatory Visit: Payer: Self-pay

## 2020-06-25 ENCOUNTER — Ambulatory Visit (INDEPENDENT_AMBULATORY_CARE_PROVIDER_SITE_OTHER): Payer: Medicaid Other

## 2020-06-25 DIAGNOSIS — M19072 Primary osteoarthritis, left ankle and foot: Secondary | ICD-10-CM

## 2020-06-25 DIAGNOSIS — M7751 Other enthesopathy of right foot: Secondary | ICD-10-CM

## 2020-06-25 DIAGNOSIS — M7752 Other enthesopathy of left foot: Secondary | ICD-10-CM | POA: Diagnosis not present

## 2020-06-25 DIAGNOSIS — M2142 Flat foot [pes planus] (acquired), left foot: Secondary | ICD-10-CM

## 2020-06-25 DIAGNOSIS — M216X2 Other acquired deformities of left foot: Secondary | ICD-10-CM | POA: Diagnosis not present

## 2020-06-25 DIAGNOSIS — M2141 Flat foot [pes planus] (acquired), right foot: Secondary | ICD-10-CM | POA: Diagnosis not present

## 2020-06-25 DIAGNOSIS — M25872 Other specified joint disorders, left ankle and foot: Secondary | ICD-10-CM

## 2020-06-25 DIAGNOSIS — M25572 Pain in left ankle and joints of left foot: Secondary | ICD-10-CM

## 2020-06-25 DIAGNOSIS — M21862 Other specified acquired deformities of left lower leg: Secondary | ICD-10-CM

## 2020-06-25 MED ORDER — CELECOXIB 100 MG PO CAPS: 100.0000 mg | ORAL_CAPSULE | Freq: Two times a day (BID) | ORAL | 2 refills | Status: AC

## 2020-06-26 ENCOUNTER — Encounter: Payer: Self-pay | Admitting: Podiatry

## 2020-06-26 MED ORDER — DEXAMETHASONE SODIUM PHOSPHATE 4 MG/ML IJ SOLN: 8.0000 mg | Freq: Once | INTRAMUSCULAR | Status: DC

## 2020-06-26 MED ORDER — TRIAMCINOLONE ACETONIDE 40 MG/ML IJ SUSP: 20.0000 mg | Freq: Once | INTRAMUSCULAR | Status: DC

## 2020-06-26 NOTE — Progress Notes (Signed)
  Subjective:  Patient ID: Carrie Bryan, female    DOB: November 28, 1965,  MRN: [id number]  Chief Complaint  Patient presents with  . Foot Pain    Left foot/ankle pain. PT stated that she has constant foot and ankle pain that is worse in the middle of the night. She stated that it is a throbbing pain    55 y.o. female presents with the above complaint. History confirmed with patient.  Has been progressively getting worse.  Describes as a throbbing ache.  Objective:  Physical Exam: warm, good capillary refill, no trophic changes or ulcerative lesions, normal DP and PT pulses and normal sensory exam. Left Foot: Limited dorsiflexion of the left ankle with previous equinus, she has pes planus deformity with collapsing medial arch, she has pain on palpation of the sinus tarsi as well as the anterolateral joint line of the ankle joint with dorsiflexion.  Radiographs: X-ray of the left foot: No fracture dislocation noted, she does have pes planovalgus deformity with collapsing arch on the lateral view, mild to moderate degenerative changes of the left subtalar and midtarsal joints, ankle joint mortise appears clear. Assessment:   1. Capsulitis of ankle, left   2. Impingement syndrome of left ankle   3. Sinus tarsi syndrome, left   4. Pes planus of both feet   5. Gastrocnemius equinus of left lower extremity   6. Arthritis of left subtalar joint      Plan:  Patient was evaluated and treated and all questions answered.  I reviewed the radiographic findings well the clinical exam and pathomechanics with the patient in detail.  We discussed how pes planovalgus deformity contributes to impingement of the subtalar joint, sinus tarsi syndrome as well as anterolateral joint line of the ankle joint.  She does not have a history of recent traumatic injury to the ankle.  I recommended support with good supportive shoes, prefabricated over-the-counter insoles and anti-inflammatories.  I sent her a  prescription for Celebrex 100 mg twice daily.  Recommended injection therapy as well to calm down the inflammation as he is currently having in the ankle joint capsule as well as the subtalar joint and sinus tarsi.  Following sterile prep with Betadine, both the subtalar joint, sinus tarsi and the left ankle joint were all injected separately with 4 mg of dexamethasone phosphate and 10 mg of triamcinolone acetonide each as well as 1 cc of 2% Xylocaine plain.  She tolerated procedure well and was dressed with a Band-Aid.  Return in about 3 months (around 09/25/2020) for recheck left ankle and foot pain and arthritis.

## 2020-07-15 ENCOUNTER — Telehealth: Payer: Self-pay | Admitting: Podiatry

## 2020-07-15 NOTE — Telephone Encounter (Signed)
Patient called and stated that she is having side affects from the medication celecoxib (CELEBREX) 100 MG She is having muscle pain and chest pains. Please Advise

## 2020-07-15 NOTE — Telephone Encounter (Signed)
No she should just stop, I wouldn't prescribe anything until she has the chest pain figured out. Other meds in that class of drugs carry risk of heart problems as well. I would tell her she should stick with tylenol for now until we know she isn't having heart problems

## 2020-07-15 NOTE — Telephone Encounter (Signed)
Ok did you want to prescribe something else or just tell her to stop taking the medication?

## 2020-07-15 NOTE — Telephone Encounter (Signed)
If she has chest pain she needs to go to the ED ASAP. She should stop taking regardless

## 2020-07-15 NOTE — Telephone Encounter (Signed)
Spoke to patient

## 2021-01-07 ENCOUNTER — Other Ambulatory Visit: Payer: Self-pay | Admitting: Internal Medicine

## 2021-01-08 LAB — URINE CULTURE
MICRO NUMBER:: 12410106
Result:: NO GROWTH
SPECIMEN QUALITY:: ADEQUATE

## 2021-01-08 LAB — C. TRACHOMATIS/N. GONORRHOEAE RNA
C. trachomatis RNA, TMA: NOT DETECTED
N. gonorrhoeae RNA, TMA: NOT DETECTED

## 2021-01-11 LAB — PAP IG W/ RFLX HPV ASCU

## 2021-05-21 ENCOUNTER — Other Ambulatory Visit: Payer: Self-pay | Admitting: Internal Medicine

## 2021-05-22 LAB — LIPID PANEL
Cholesterol: 265 mg/dL — ABNORMAL HIGH (ref <200)
HDL: 90 mg/dL (ref >=50)
LDL Cholesterol (Calc): 156 mg/dL (calc) — ABNORMAL HIGH
Non-HDL Cholesterol (Calc): 175 mg/dL (calc) — ABNORMAL HIGH (ref <130)
Total CHOL/HDL Ratio: 2.9 (calc) (ref <5.0)
Triglycerides: 85 mg/dL (ref <150)

## 2021-05-22 LAB — COMPLETE METABOLIC PANEL WITH GFR
AG Ratio: 1.6 (calc) (ref 1.0–2.5)
ALT: 22 U/L (ref 6–29)
AST: 28 U/L (ref 10–35)
Albumin: 4.6 g/dL (ref 3.6–5.1)
Alkaline phosphatase (APISO): 101 U/L (ref 37–153)
BUN/Creatinine Ratio: 14 (calc) (ref 6–22)
BUN: 15 mg/dL (ref 7–25)
CO2: 26 mmol/L (ref 20–32)
Calcium: 9.6 mg/dL (ref 8.6–10.4)
Chloride: 105 mmol/L (ref 98–110)
Creat: 1.07 mg/dL — ABNORMAL HIGH (ref 0.50–1.03)
Globulin: 2.9 g/dL (calc) (ref 1.9–3.7)
Glucose, Bld: 77 mg/dL (ref 65–99)
Potassium: 3.7 mmol/L (ref 3.5–5.3)
Sodium: 143 mmol/L (ref 135–146)
Total Bilirubin: 1.5 mg/dL — ABNORMAL HIGH (ref 0.2–1.2)
Total Protein: 7.5 g/dL (ref 6.1–8.1)
eGFR: 61 mL/min/{1.73_m2} (ref >=60)

## 2021-05-22 LAB — URINE CULTURE
MICRO NUMBER:: 12961171
Result:: NO GROWTH
SPECIMEN QUALITY:: ADEQUATE

## 2021-05-22 LAB — CBC
HCT: 42.8 % (ref 35.0–45.0)
Hemoglobin: 14.6 g/dL (ref 11.7–15.5)
MCH: 31.9 pg (ref 27.0–33.0)
MCHC: 34.1 g/dL (ref 32.0–36.0)
MCV: 93.7 fL (ref 80.0–100.0)
MPV: 11.5 fL (ref 7.5–12.5)
Platelets: 272 Thousand/uL (ref 140–400)
RBC: 4.57 Million/uL (ref 3.80–5.10)
RDW: 12.6 % (ref 11.0–15.0)
WBC: 13.4 Thousand/uL — ABNORMAL HIGH (ref 3.8–10.8)

## 2021-05-22 LAB — VITAMIN D 25 HYDROXY (VIT D DEFICIENCY, FRACTURES): Vit D, 25-Hydroxy: 21 ng/mL — ABNORMAL LOW (ref 30–100)

## 2021-05-22 LAB — TSH: TSH: 0.28 mIU/L — ABNORMAL LOW (ref 0.40–4.50)

## 2021-09-18 ENCOUNTER — Encounter (HOSPITAL_COMMUNITY): Payer: Self-pay | Admitting: Emergency Medicine

## 2021-09-18 ENCOUNTER — Ambulatory Visit (HOSPITAL_COMMUNITY)
Admission: EM | Admit: 2021-09-18 | Discharge: 2021-09-18 | Disposition: A | Discharge status: Home / Self Care | Payer: Medicaid Other | Attending: Internal Medicine | Admitting: Internal Medicine

## 2021-09-18 DIAGNOSIS — N3000 Acute cystitis without hematuria: Secondary | ICD-10-CM | POA: Diagnosis present

## 2021-09-18 LAB — POCT URINALYSIS DIPSTICK, ED / UC
Bilirubin Urine: NEGATIVE
Glucose, UA: NEGATIVE mg/dL
Hgb urine dipstick: NEGATIVE
Ketones, ur: NEGATIVE mg/dL
Nitrite: NEGATIVE
Protein, ur: NEGATIVE mg/dL
Specific Gravity, Urine: 1.025 (ref 1.005–1.030)
Urobilinogen, UA: 0.2 mg/dL (ref 0.0–1.0)
pH: 6.5 (ref 5.0–8.0)

## 2021-09-18 MED ORDER — CEPHALEXIN 500 MG PO CAPS: 500.0000 mg | ORAL_CAPSULE | Freq: Three times a day (TID) | ORAL | 0 refills | Status: AC

## 2021-09-18 NOTE — ED Provider Notes (Signed)
[place]   MRN: [id number] DOB: 1965-06-21  Subjective:   Carrie Bryan is a 56 y.o. female presenting for complaints of burning with urination, frequent urination, lower back pain over the last 4 days.  Patient states that she has a history of recurrent bladder infections.  She is not sexually active at this time.  Denies chance for pregnancy.  She has been drinking water, but no other treatments tried at this time.  Denies any fever or chills.  No nausea or vomiting.  No blood in the urine.   Current Facility-Administered Medications:    dexamethasone (DECADRON) injection 8 mg, 8 mg, Intra-articular, Once, McDonald, Adam R, DPM   triamcinolone acetonide (KENALOG-40) injection 20 mg, 20 mg, Intra-articular, Once, McDonald, Stephan Minister, DPM  Current Outpatient Medications:    cephALEXin (KEFLEX) 500 MG capsule, Take 1 capsule (500 mg total) by mouth 3 (three) times daily for 7 days., Disp: 21 capsule, Rfl: 0   acetaminophen (TYLENOL) 500 MG tablet, Take 1 tablet (500 mg total) by mouth every 6 (six) hours as needed., Disp: 30 tablet, Rfl: 0   cyclobenzaprine (FLEXERIL) 10 MG tablet, Take 1 tablet (10 mg total) by mouth at bedtime., Disp: 12 tablet, Rfl: 0   fluconazole (DIFLUCAN) 150 MG tablet, Take 1 tablet (150 mg total) by mouth daily. Repeat in 1 week if needed, Disp: 2 tablet, Rfl: 0   furosemide (LASIX) 20 MG tablet, Take 25 mg by mouth., Disp: , Rfl:    hydrochlorothiazide (HYDRODIURIL) 25 MG tablet, Take 25 mg by mouth daily.  , Disp: , Rfl:    nitrofurantoin, macrocrystal-monohydrate, (MACROBID) 100 MG capsule, Take 1 capsule (100 mg total) by mouth 2 (two) times daily., Disp: 10 capsule, Rfl: 0   potassium chloride SA (KLOR-CON M15) 15 MEQ tablet, Take 15 mEq by mouth 2 (two) times daily., Disp: , Rfl:    sertraline (ZOLOFT) 25 MG tablet, Take 25 mg by mouth daily., Disp: , Rfl:    Not on File  Past Medical History:  Diagnosis Date   Anxiety    Bacterial  vaginosis    Depression    Hypertension      Past Surgical History:  Procedure Laterality Date   ABDOMINAL HYSTERECTOMY N/A 09/23/2014   Procedure: HYSTERECTOMY ABDOMINAL;  Surgeon: Lavonia Drafts, MD;  Location: [facility];  Service: Gynecology;  Laterality: N/A;  Requested 09/23/14 @ 10:00a   BILATERAL SALPINGECTOMY Bilateral 09/23/2014   Procedure: BILATERAL SALPINGECTOMY;  Surgeon: Lavonia Drafts, MD;  Location: [facility];  Service: Gynecology;  Laterality: Bilateral;   CHOLECYSTECTOMY     DILATION AND CURETTAGE OF UTERUS      History reviewed. No pertinent family history.  Social History   Tobacco Use   Smoking status: Never   Smokeless tobacco: Never  Substance Use Topics   Alcohol use: No   Drug use: No    ROS REFER TO HPI FOR PERTINENT POSITIVES AND NEGATIVES   Objective:   Vitals: BP (!) 130/56 (BP Location: Left Arm)   Pulse 72   Temp 97.7 F (36.5 C) (Oral)   Resp 16   LMP 09/10/2014 (Approximate)   SpO2 94%   Physical Exam Vitals and nursing note reviewed.  Constitutional:      General: She is not in acute distress.    Appearance: Normal appearance. She is not ill-appearing.  HENT:     Head: Normocephalic and atraumatic.  Cardiovascular:     Rate and Rhythm: Normal rate and regular  rhythm.     Pulses: Normal pulses.     Heart sounds: Normal heart sounds.  Pulmonary:     Effort: Pulmonary effort is normal.     Breath sounds: Normal breath sounds.  Abdominal:     General: Abdomen is flat. Bowel sounds are normal. There is no distension.     Palpations: Abdomen is soft.     Tenderness: There is no abdominal tenderness. There is no right CVA tenderness or left CVA tenderness.  Skin:    General: Skin is warm and dry.  Neurological:     General: No focal deficit present.     Mental Status: She is alert.  Psychiatric:        Mood and Affect: Mood normal.    Results for orders placed or performed during the hospital encounter of 09/18/21  (from the past 24 hour(s))  POC Urinalysis dipstick     Status: Abnormal   Collection Time: 09/18/21  1:21 PM  Result Value Ref Range   Glucose, UA NEGATIVE NEGATIVE mg/dL   Bilirubin Urine NEGATIVE NEGATIVE   Ketones, ur NEGATIVE NEGATIVE mg/dL   Specific Gravity, Urine 1.025 1.005 - 1.030   Hgb urine dipstick NEGATIVE NEGATIVE   pH 6.5 5.0 - 8.0   Protein, ur NEGATIVE NEGATIVE mg/dL   Urobilinogen, UA 0.2 0.0 - 1.0 mg/dL   Nitrite NEGATIVE NEGATIVE   Leukocytes,Ua SMALL (A) NEGATIVE    Assessment and Plan :   PDMP not reviewed this encounter.  1. Acute cystitis without hematuria   Symptoms consistent with patient's history of recurrent urinary tract infections. Urinalysis shows small amount of leukocytes present. Plan to culture urine We will start patient on cephalexin 500 mg 3 times daily x7 days, change antibiotic pending culture results if needed. She may try AZO over-the-counter. ER precautions advised.  Yarelin Reichardt, PA-C        Ashlynn Gunnels, Randa Evens, PA-C 09/18/21 1354

## 2021-09-18 NOTE — ED Triage Notes (Signed)
Lower back pain, burning with urination, and frequent urination over the last 4 days. Reports frequent UTIs

## 2021-09-19 LAB — URINE CULTURE

## 2021-10-15 ENCOUNTER — Other Ambulatory Visit: Payer: Self-pay | Admitting: Internal Medicine

## 2021-10-16 LAB — URINE CULTURE
MICRO NUMBER:: 13594417
Result:: NO GROWTH
SPECIMEN QUALITY:: ADEQUATE

## 2022-01-14 ENCOUNTER — Encounter (HOSPITAL_COMMUNITY): Payer: Self-pay | Admitting: *Deleted

## 2022-01-14 ENCOUNTER — Ambulatory Visit (HOSPITAL_COMMUNITY)
Admission: EM | Admit: 2022-01-14 | Discharge: 2022-01-14 | Disposition: A | Discharge status: Home / Self Care | Payer: Medicaid Other | Attending: Family Medicine | Admitting: Family Medicine

## 2022-01-14 DIAGNOSIS — L03012 Cellulitis of left finger: Secondary | ICD-10-CM

## 2022-01-14 MED ORDER — MUPIROCIN 2 % EX OINT: 1.0000 | TOPICAL_OINTMENT | Freq: Two times a day (BID) | CUTANEOUS | 0 refills | Status: DC

## 2022-01-14 MED ORDER — AMOXICILLIN-POT CLAVULANATE 875-125 MG PO TABS: 1.0000 | ORAL_TABLET | Freq: Two times a day (BID) | ORAL | 0 refills | Status: AC

## 2022-01-14 NOTE — ED Triage Notes (Signed)
Pts left middle finger is swollen by the nail bed. She has been soaking it and that's not helping.

## 2022-01-14 NOTE — ED Provider Notes (Signed)
[facility]    CSN: [id number] Arrival date & time: 01/14/22  1117      History   Chief Complaint Chief Complaint  Patient presents with   Nail Problem    HPI GENOVEVA SINGLETON is a 56 y.o. female.   HPI Here for swelling around her left middle finger nail.  Its been going on about 3 days.  No drainage so far.  No fever or chills and no vomiting.  She is allergic to no meds  She has some ibuprofen at home which has been helping the discomfort  Past Medical History:  Diagnosis Date   Anxiety    Bacterial vaginosis    Depression    Hypertension     Patient Active Problem List   Diagnosis Date Noted   Fibroids 09/23/2014   Post-operative state 09/23/2014    Past Surgical History:  Procedure Laterality Date   ABDOMINAL HYSTERECTOMY N/A 09/23/2014   Procedure: HYSTERECTOMY ABDOMINAL;  Surgeon: Lavonia Drafts, MD;  Location: [facility];  Service: Gynecology;  Laterality: N/A;  Requested 09/23/14 @ 10:00a   BILATERAL SALPINGECTOMY Bilateral 09/23/2014   Procedure: BILATERAL SALPINGECTOMY;  Surgeon: Lavonia Drafts, MD;  Location: [facility];  Service: Gynecology;  Laterality: Bilateral;   CHOLECYSTECTOMY     DILATION AND CURETTAGE OF UTERUS      OB History     Gravida  10   Para  3   Term  3   Preterm      AB  7   Living  3      SAB  1   IAB  6   Ectopic      Multiple      Live Births               Home Medications    Prior to Admission medications   Medication Sig Start Date End Date Taking? Authorizing Provider  amLODipine (NORVASC) 5 MG tablet Take 5 mg by mouth daily. 12/29/21  Yes [provider]  amoxicillin-clavulanate (AUGMENTIN) 875-125 MG tablet Take 1 tablet by mouth 2 (two) times daily for 7 days. 01/14/22 01/21/22 Yes Barrett Henle, MD  furosemide (LASIX) 20 MG tablet Take 25 mg by mouth.   Yes [provider]  mupirocin ointment (BACTROBAN) 2 % Apply 1 Application topically 2 (two) times  daily. To affected area till better 01/14/22  Yes Emanuelle Bastos, Gwenlyn Perking, MD  acetaminophen (TYLENOL) 500 MG tablet Take 1 tablet (500 mg total) by mouth every 6 (six) hours as needed. 06/12/18   Wurst, [place], PA-C  cyclobenzaprine (FLEXERIL) 10 MG tablet Take 1 tablet (10 mg total) by mouth at bedtime. 06/12/18   Wurst, [place], PA-C  hydrochlorothiazide (HYDRODIURIL) 25 MG tablet Take 25 mg by mouth daily.      [provider]  potassium chloride SA (KLOR-CON M15) 15 MEQ tablet Take 15 mEq by mouth 2 (two) times daily.    [provider]  sertraline (ZOLOFT) 25 MG tablet Take 25 mg by mouth daily.    [provider]    Family History History reviewed. No pertinent family history.  Social History Social History   Tobacco Use   Smoking status: Never   Smokeless tobacco: Never  Substance Use Topics   Alcohol use: No   Drug use: No     Allergies   Patient has no known allergies.   Review of Systems Review of Systems   Physical Exam Triage Vital Signs ED Triage Vitals  Enc  Vitals Group     BP 01/14/22 1234 (!) 151/90     Pulse Rate 01/14/22 1234 65     Resp 01/14/22 1234 18     Temp 01/14/22 1234 98 F (36.7 C)     Temp Source 01/14/22 1234 Oral     SpO2 01/14/22 1234 96 %     Weight --      Height --      Head Circumference --      Peak Flow --      Pain Score 01/14/22 1232 0     Pain Loc --      Pain Edu? --      Excl. in [place]? --    No data found.  Updated Vital Signs BP (!) 151/90 (BP Location: Right Arm)   Pulse 65   Temp 98 F (36.7 C) (Oral)   Resp 18   LMP 09/10/2014 (Approximate)   SpO2 96%   Visual Acuity Right Eye Distance:   Left Eye Distance:   Bilateral Distance:    Right Eye Near:   Left Eye Near:    Bilateral Near:     Physical Exam Vitals reviewed.  Constitutional:      General: She is not in acute distress.    Appearance: She is not ill-appearing, toxic-appearing or diaphoretic.  Skin:    Comments: On  the ulnar aspect of her left middle finger nail fold she has erythema and induration.  I cannot discern any fluctuance at this time.  The erythema and induration extends about 1 cm along her nail and about half a centimeter away from the finger nail fold  Neurological:     Mental Status: She is alert and oriented to person, place, and time.  Psychiatric:        Behavior: Behavior normal.      UC Treatments / Results  Labs (all labs ordered are listed, but only abnormal results are displayed) Labs Reviewed - No data to display  EKG   Radiology No results found.  Procedures Procedures (including critical care time)  Medications Ordered in UC Medications - No data to display  Initial Impression / Assessment and Plan / UC Course  I have reviewed the triage vital signs and the nursing notes.  Pertinent labs & imaging results that were available during my care of the patient were reviewed by me and considered in my medical decision making (see chart for details).        I am going to treat with Augmentin and mupirocin.  Continue the warm soaks.  She is instructed return if she starts having an abscess pointing or having worsening swelling Final Clinical Impressions(s) / UC Diagnoses   Final diagnoses:  Paronychia of finger, left     Discharge Instructions      Take amoxicillin-clavulanate 875 mg--1 tab twice daily with food for 7 days  Put mupirocin ointment on the sore areas twice daily until improved  Continue using the warm compresses to the area 2 or 3 times a day  Turn to the swelling is worsening or is not improving after 48 hours of therapy     ED Prescriptions     Medication Sig Dispense Auth. Provider   amoxicillin-clavulanate (AUGMENTIN) 875-125 MG tablet Take 1 tablet by mouth 2 (two) times daily for 7 days. 14 tablet Kenadee Gates, Gwenlyn Perking, MD   mupirocin ointment (BACTROBAN) 2 % Apply 1 Application topically 2 (two) times daily. To affected area till  better 22 g  Barrett Henle, MD      PDMP not reviewed this encounter.   Barrett Henle, MD 01/14/22 1258

## 2022-01-14 NOTE — Discharge Instructions (Addendum)
Take amoxicillin-clavulanate 875 mg--1 tab twice daily with food for 7 days  Put mupirocin ointment on the sore areas twice daily until improved  Continue using the warm compresses to the area 2 or 3 times a day  Turn to the swelling is worsening or is not improving after 48 hours of therapy

## 2022-05-23 ENCOUNTER — Other Ambulatory Visit: Payer: Self-pay | Admitting: Internal Medicine

## 2022-05-24 LAB — CBC
HCT: 39 % (ref 35.0–45.0)
Hemoglobin: 13.5 g/dL (ref 11.7–15.5)
MCH: 32.5 pg (ref 27.0–33.0)
MCHC: 34.6 g/dL (ref 32.0–36.0)
MCV: 94 fL (ref 80.0–100.0)
MPV: 11.6 fL (ref 7.5–12.5)
Platelets: 213 10*3/uL (ref 140–400)
RBC: 4.15 10*6/uL (ref 3.80–5.10)
RDW: 12.3 % (ref 11.0–15.0)
WBC: 4.8 10*3/uL (ref 3.8–10.8)

## 2022-05-24 LAB — URINE CULTURE

## 2022-05-24 LAB — COMPLETE METABOLIC PANEL WITH GFR
AG Ratio: 1.4 (calc) (ref 1.0–2.5)
ALT: 13 U/L (ref 6–29)
AST: 16 U/L (ref 10–35)
Albumin: 4 g/dL (ref 3.6–5.1)
Alkaline phosphatase (APISO): 94 U/L (ref 37–153)
BUN: 11 mg/dL (ref 7–25)
CO2: 25 mmol/L (ref 20–32)
Calcium: 9 mg/dL (ref 8.6–10.4)
Chloride: 108 mmol/L (ref 98–110)
Creat: 0.96 mg/dL (ref 0.50–1.03)
Globulin: 2.8 g/dL (calc) (ref 1.9–3.7)
Glucose, Bld: 85 mg/dL (ref 65–99)
Potassium: 3.8 mmol/L (ref 3.5–5.3)
Sodium: 143 mmol/L (ref 135–146)
Total Bilirubin: 0.7 mg/dL (ref 0.2–1.2)
Total Protein: 6.8 g/dL (ref 6.1–8.1)
eGFR: 69 mL/min/{1.73_m2} (ref >=60)

## 2022-05-24 LAB — LIPID PANEL
Cholesterol: 202 mg/dL — ABNORMAL HIGH (ref <200)
HDL: 64 mg/dL (ref >=50)
LDL Cholesterol (Calc): 118 mg/dL (calc) — ABNORMAL HIGH
Non-HDL Cholesterol (Calc): 138 mg/dL (calc) — ABNORMAL HIGH (ref <130)
Total CHOL/HDL Ratio: 3.2 (calc) (ref <5.0)
Triglycerides: 98 mg/dL (ref <150)

## 2022-05-24 LAB — TSH: TSH: 0.63 mIU/L (ref 0.40–4.50)

## 2022-05-24 LAB — VITAMIN D 25 HYDROXY (VIT D DEFICIENCY, FRACTURES): Vit D, 25-Hydroxy: 28 ng/mL — ABNORMAL LOW (ref 30–100)

## 2022-05-25 ENCOUNTER — Other Ambulatory Visit: Payer: Self-pay | Admitting: Internal Medicine

## 2022-05-25 DIAGNOSIS — Z1231 Encounter for screening mammogram for malignant neoplasm of breast: Secondary | ICD-10-CM

## 2022-06-02 ENCOUNTER — Inpatient Hospital Stay: Admission: RE | Admit: 2022-06-02 | Payer: Medicaid Other | Source: Ambulatory Visit

## 2022-07-20 ENCOUNTER — Ambulatory Visit: Payer: Medicaid Other

## 2022-12-15 ENCOUNTER — Emergency Department (HOSPITAL_COMMUNITY)
Admission: EM | Admit: 2022-12-15 | Discharge: 2022-12-15 | Disposition: A | Discharge status: Home / Self Care | Payer: No Typology Code available for payment source | Attending: Emergency Medicine | Admitting: Emergency Medicine

## 2022-12-15 ENCOUNTER — Emergency Department (HOSPITAL_COMMUNITY): Payer: No Typology Code available for payment source

## 2022-12-15 ENCOUNTER — Other Ambulatory Visit: Payer: Self-pay

## 2022-12-15 ENCOUNTER — Encounter (HOSPITAL_COMMUNITY): Payer: Self-pay

## 2022-12-15 DIAGNOSIS — M542 Cervicalgia: Secondary | ICD-10-CM | POA: Diagnosis present

## 2022-12-15 DIAGNOSIS — M546 Pain in thoracic spine: Secondary | ICD-10-CM | POA: Insufficient documentation

## 2022-12-15 DIAGNOSIS — M5440 Lumbago with sciatica, unspecified side: Secondary | ICD-10-CM | POA: Insufficient documentation

## 2022-12-15 DIAGNOSIS — Z79899 Other long term (current) drug therapy: Secondary | ICD-10-CM | POA: Diagnosis not present

## 2022-12-15 DIAGNOSIS — Y92410 Unspecified street and highway as the place of occurrence of the external cause: Secondary | ICD-10-CM | POA: Insufficient documentation

## 2022-12-15 DIAGNOSIS — I10 Essential (primary) hypertension: Secondary | ICD-10-CM | POA: Insufficient documentation

## 2022-12-15 MED ORDER — METHOCARBAMOL 500 MG PO TABS: 500.0000 mg | ORAL_TABLET | Freq: Two times a day (BID) | ORAL | 0 refills | Status: AC

## 2022-12-15 MED ORDER — METHOCARBAMOL 500 MG PO TABS
500.0000 mg | ORAL_TABLET | Freq: Once | ORAL | Status: AC
  Administered 2022-12-15: 500 mg via ORAL
  Filled 2022-12-15: qty 1
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
## 2022-12-15 NOTE — Discharge Instructions (Addendum)
As discussed, your x-ray shows stenosis, or arthritic changes, to the neck and lower back.  There is no signs of any fractures or misalignment of the vertebrae.  I have sent a prescription of Robaxin to your pharmacy you can take this up to twice a day as needed for pain.  Do not drive or operate heavy machinery while taking this medication.  Follow-up with your primary care provider in the next 3 to 5 days reevaluation of your symptoms.  Get help right away if: You have shortness of breath. You have light-headedness or you faint. You have chest pain. You have these eye or vision changes: Sudden vision loss or double vision. Your eye suddenly turns red. The black center of your eye (pupil) is an odd shape or size.

## 2022-12-15 NOTE — ED Notes (Signed)
Pt ambulated to and from triage room without assistance.

## 2023-06-20 ENCOUNTER — Other Ambulatory Visit: Payer: Self-pay | Admitting: Internal Medicine

## 2023-06-20 DIAGNOSIS — E2839 Other primary ovarian failure: Secondary | ICD-10-CM

## 2023-06-21 ENCOUNTER — Other Ambulatory Visit: Payer: Self-pay | Admitting: Internal Medicine

## 2023-06-21 DIAGNOSIS — Z1231 Encounter for screening mammogram for malignant neoplasm of breast: Secondary | ICD-10-CM

## 2023-06-22 ENCOUNTER — Inpatient Hospital Stay: Admission: RE | Admit: 2023-06-22 | Source: Ambulatory Visit

## 2023-11-20 ENCOUNTER — Encounter (HOSPITAL_COMMUNITY): Payer: Self-pay | Admitting: *Deleted

## 2023-11-20 ENCOUNTER — Other Ambulatory Visit: Payer: Self-pay

## 2023-11-20 ENCOUNTER — Ambulatory Visit (HOSPITAL_COMMUNITY)
Admission: EM | Admit: 2023-11-20 | Discharge: 2023-11-20 | Disposition: A | Discharge status: Home / Self Care | Attending: Family Medicine | Admitting: Family Medicine

## 2023-11-20 DIAGNOSIS — H01119 Allergic dermatitis of unspecified eye, unspecified eyelid: Secondary | ICD-10-CM

## 2023-11-20 MED ORDER — PREDNISONE 20 MG PO TABS
40.0000 mg | ORAL_TABLET | Freq: Every day | ORAL | 0 refills | Status: AC
Start: 2023-11-20 — End: 2023-11-25

## 2023-11-20 MED ORDER — CETIRIZINE HCL 10 MG PO TABS: 10.0000 mg | ORAL_TABLET | Freq: Every day | ORAL | 0 refills | Status: AC | PRN

## 2023-11-20 MED ORDER — GENTAMICIN SULFATE 0.3 % OP SOLN
2.0000 [drp] | Freq: Three times a day (TID) | OPHTHALMIC | 0 refills | Status: AC
Start: 2023-11-20 — End: 2023-11-25

## 2023-11-20 NOTE — ED Provider Notes (Addendum)
 [facility]    CSN: [id number] Arrival date & time: 11/20/23  1151      History   Chief Complaint Chief Complaint  Patient presents with   Eye Drainage    HPI Carrie Bryan is a 58 y.o. female.   HPI Here for itching and rash of her upper and lower eyelids.  Her eyes have also been itchy and burning and they are watering.  When she awakens in the morning she will have her eyelids stuck shut.  No fever. Symptoms began about 4 days ago.  Over-the-counter medications have not helped.  She does not have diabetes She has had this rash before but usually does not last this long.  She has never had to have medical treatment for it previously.  No new muscle pain or other rashes.  No trouble breathing and no swelling in her throat or tongue or lips.  Past surgical history is significant for hysterectomy.  NKDA Past Medical History:  Diagnosis Date   Anxiety    Bacterial vaginosis    Depression    Hypertension     Patient Active Problem List   Diagnosis Date Noted   Fibroids 09/23/2014   Post-operative state 09/23/2014    Past Surgical History:  Procedure Laterality Date   ABDOMINAL HYSTERECTOMY N/A 09/23/2014   Procedure: HYSTERECTOMY ABDOMINAL;  Surgeon: Elveria Mungo, MD;  Location: [facility];  Service: Gynecology;  Laterality: N/A;  Requested 09/23/14 @ 10:00a   BILATERAL SALPINGECTOMY Bilateral 09/23/2014   Procedure: BILATERAL SALPINGECTOMY;  Surgeon: Elveria Mungo, MD;  Location: [facility];  Service: Gynecology;  Laterality: Bilateral;   CHOLECYSTECTOMY     DILATION AND CURETTAGE OF UTERUS      OB History     Gravida  10   Para  3   Term  3   Preterm      AB  7   Living  3      SAB  1   IAB  6   Ectopic      Multiple      Live Births               Home Medications    Prior to Admission medications   Medication Sig Start Date End Date Taking? Authorizing Provider  amLODipine (NORVASC) 5 MG tablet Take 5 mg by  mouth daily. 12/29/21  Yes [provider]  cetirizine  (ZYRTEC  ALLERGY) 10 MG tablet Take 1 tablet (10 mg total) by mouth daily as needed for allergies. 11/20/23  Yes Dorreen Valiente K, MD  furosemide (LASIX) 20 MG tablet Take 25 mg by mouth.   Yes [provider]  gentamicin  (GARAMYCIN ) 0.3 % ophthalmic solution Place 2 drops into both eyes 3 (three) times daily for 5 days. 11/20/23 11/25/23 Yes Dellamae Rosamilia, Sharlet MARLA, MD  potassium chloride  SA (KLOR-CON  M15) 15 MEQ tablet Take 15 mEq by mouth 2 (two) times daily.   Yes [provider]  predniSONE  (DELTASONE ) 20 MG tablet Take 2 tablets (40 mg total) by mouth daily with breakfast for 5 days. 11/20/23 11/25/23 Yes Vonna Sharlet MARLA, MD    Family History History reviewed. No pertinent family history.  Social History Social History   Tobacco Use   Smoking status: Never   Smokeless tobacco: Never  Substance Use Topics   Alcohol use: No   Drug use: No     Allergies   Patient has no known allergies.   Review of Systems Review of Systems   Physical  Exam Triage Vital Signs ED Triage Vitals  Encounter Vitals Group     BP 11/20/23 1351 122/78     Girls Systolic BP Percentile --      Girls Diastolic BP Percentile --      Boys Systolic BP Percentile --      Boys Diastolic BP Percentile --      Pulse Rate 11/20/23 1351 (!) 57     Resp 11/20/23 1351 20     Temp 11/20/23 1351 97.7 F (36.5 C)     Temp src --      SpO2 11/20/23 1351 95 %     Weight --      Height --      Head Circumference --      Peak Flow --      Pain Score 11/20/23 1349 1     Pain Loc --      Pain Education --      Exclude from Growth Chart --    No data found.  Updated Vital Signs BP 122/78   Pulse (!) 57   Temp 97.7 F (36.5 C)   Resp 20   LMP 09/10/2014 (Approximate)   SpO2 95%   Visual Acuity Right Eye Distance:   Left Eye Distance:   Bilateral Distance:    Right Eye Near:   Left Eye Near:    Bilateral Near:      Physical Exam Vitals reviewed.  Constitutional:      General: She is not in acute distress.    Appearance: She is not ill-appearing, toxic-appearing or diaphoretic.  HENT:     Mouth/Throat:     Mouth: Mucous membranes are moist.     Comments: No edema of the lips or tongue.  Oropharynx is benign Eyes:     Extraocular Movements: Extraocular movements intact.     Pupils: Pupils are equal, round, and reactive to light.     Comments: The left conjunctiva is slightly injected.  Bilaterally the upper and lower eyelids have a rash that is dusky in color and there is a little discharge in the inner canthus of the left eye.  Please see attached photos  Cardiovascular:     Rate and Rhythm: Normal rate and regular rhythm.  Pulmonary:     Effort: Pulmonary effort is normal.     Breath sounds: Normal breath sounds.  Skin:    Coloration: Skin is not pale.  Neurological:     General: No focal deficit present.     Mental Status: She is alert and oriented to person, place, and time.  Psychiatric:        Behavior: Behavior normal.      UC Treatments / Results  Labs (all labs ordered are listed, but only abnormal results are displayed) Labs Reviewed - No data to display  EKG   Radiology No results found.  Procedures Procedures (including critical care time)  Medications Ordered in UC Medications - No data to display  Initial Impression / Assessment and Plan / UC Course  I have reviewed the triage vital signs and the nursing notes.  Pertinent labs & imaging results that were available during my care of the patient were reviewed by me and considered in my medical decision making (see chart for details).     She declined a Decadron  injection.  Prednisone  is sent in for this rash which could be allergic in nature.  I have also sent in gentamicin  eyedrops for potential conjunctivitis. I have asked her  to follow-up with her primary care  Final Clinical Impressions(s) / UC  Diagnoses   Final diagnoses:  Eyelid dermatitis, allergic/contact     Discharge Instructions      Take prednisone  20 mg--2 daily for 5 days  Put gentamicin  eyedrops in the affected eye(s) 3 times daily for 5 days.  Zyrtec /cetirizine  10 mg tablet--take 1 daily as needed for allergy or itching.  Please follow-up with your primary care about this issue      ED Prescriptions     Medication Sig Dispense Auth. Provider   predniSONE  (DELTASONE ) 20 MG tablet Take 2 tablets (40 mg total) by mouth daily with breakfast for 5 days. 10 tablet Vonna Sharlet POUR, MD   gentamicin  (GARAMYCIN ) 0.3 % ophthalmic solution Place 2 drops into both eyes 3 (three) times daily for 5 days. 5 mL Vonna Sharlet POUR, MD   cetirizine  (ZYRTEC  ALLERGY) 10 MG tablet Take 1 tablet (10 mg total) by mouth daily as needed for allergies. 30 tablet Lugene Hitt K, MD      PDMP not reviewed this encounter.   Vonna Sharlet POUR, MD 11/20/23 1440    Vonna Sharlet POUR, MD 11/20/23 1441    Vonna Sharlet POUR, MD 11/20/23 [phone]

## 2023-11-20 NOTE — ED Triage Notes (Signed)
 PT reports for the past 4 days her eyes have been watery,itching and burning. Pt thinks the cause is allergies. Pt took benadryl  and used eye drops. No relief from meds.

## 2023-11-20 NOTE — Discharge Instructions (Addendum)
 Take prednisone  20 mg--2 daily for 5 days  Put gentamicin  eyedrops in the affected eye(s) 3 times daily for 5 days.  Zyrtec /cetirizine  10 mg tablet--take 1 daily as needed for allergy or itching.  Please follow-up with your primary care about this issue

## 2023-12-06 DIAGNOSIS — F332 Major depressive disorder, recurrent severe without psychotic features: Secondary | ICD-10-CM | POA: Diagnosis not present

## 2023-12-07 ENCOUNTER — Other Ambulatory Visit: Payer: Self-pay | Admitting: Internal Medicine

## 2023-12-07 DIAGNOSIS — N644 Mastodynia: Secondary | ICD-10-CM

## 2023-12-20 DIAGNOSIS — F332 Major depressive disorder, recurrent severe without psychotic features: Secondary | ICD-10-CM | POA: Diagnosis not present

## 2023-12-21 ENCOUNTER — Ambulatory Visit

## 2023-12-27 ENCOUNTER — Ambulatory Visit: Admitting: Family Medicine

## 2024-01-01 DIAGNOSIS — F332 Major depressive disorder, recurrent severe without psychotic features: Secondary | ICD-10-CM | POA: Diagnosis not present

## 2024-02-22 DIAGNOSIS — F332 Major depressive disorder, recurrent severe without psychotic features: Secondary | ICD-10-CM | POA: Diagnosis not present

## 2024-02-29 DIAGNOSIS — F332 Major depressive disorder, recurrent severe without psychotic features: Secondary | ICD-10-CM | POA: Diagnosis not present

## 2024-02-29 DIAGNOSIS — F4310 Post-traumatic stress disorder, unspecified: Secondary | ICD-10-CM | POA: Diagnosis not present

## 2024-03-07 ENCOUNTER — Ambulatory Visit: Admitting: Nurse Practitioner

## 2024-06-17 ENCOUNTER — Ambulatory Visit (HOSPITAL_COMMUNITY): Payer: Self-pay

## 2024-06-25 ENCOUNTER — Ambulatory Visit (HOSPITAL_COMMUNITY): Payer: Self-pay
